# Patient Record
Sex: Female | Born: 1955 | Race: White | Hispanic: No | Marital: Married | Smoking: Former smoker
Health system: Southern US, Community
[De-identification: ages and names within clinical notes are randomized; demographics above are authoritative.]

## PROBLEM LIST (undated history)

## (undated) DIAGNOSIS — F419 Anxiety disorder, unspecified: Secondary | ICD-10-CM

## (undated) DIAGNOSIS — E785 Hyperlipidemia, unspecified: Secondary | ICD-10-CM

## (undated) DIAGNOSIS — I10 Essential (primary) hypertension: Secondary | ICD-10-CM

## (undated) DIAGNOSIS — M810 Age-related osteoporosis without current pathological fracture: Secondary | ICD-10-CM

## (undated) DIAGNOSIS — K219 Gastro-esophageal reflux disease without esophagitis: Secondary | ICD-10-CM

## (undated) HISTORY — DX: Age-related osteoporosis without current pathological fracture: M81.0

## (undated) HISTORY — PX: BUNIONECTOMY: SHX129

## (undated) HISTORY — DX: Essential (primary) hypertension: I10

## (undated) HISTORY — PX: RHINOPLASTY: SUR1284

## (undated) HISTORY — PX: FRONTAL SINUS EXPLORATION: SHX6591

## (undated) HISTORY — DX: Gastro-esophageal reflux disease without esophagitis: K21.9

## (undated) HISTORY — DX: Anxiety disorder, unspecified: F41.9

---

## 1976-07-30 HISTORY — PX: GANGLION CYST EXCISION: SHX1691

## 2005-07-30 HISTORY — PX: ABDOMINAL HYSTERECTOMY: SHX81

## 2014-07-30 HISTORY — PX: COLONOSCOPY: SHX174

## 2015-04-30 LAB — HM COLONOSCOPY

## 2015-06-09 LAB — HM COLONOSCOPY

## 2015-08-19 ENCOUNTER — Encounter: Payer: Self-pay | Admitting: Oncology

## 2015-08-19 ENCOUNTER — Inpatient Hospital Stay: Payer: Managed Care, Other (non HMO) | Attending: Oncology | Admitting: Oncology

## 2015-08-19 ENCOUNTER — Inpatient Hospital Stay: Payer: Managed Care, Other (non HMO)

## 2015-08-19 VITALS — BP 135/78 | HR 74 | Temp 98.2°F | Resp 20 | Ht 64.57 in | Wt 199.1 lb

## 2015-08-19 DIAGNOSIS — D7589 Other specified diseases of blood and blood-forming organs: Secondary | ICD-10-CM | POA: Diagnosis present

## 2015-08-19 DIAGNOSIS — I10 Essential (primary) hypertension: Secondary | ICD-10-CM | POA: Insufficient documentation

## 2015-08-19 DIAGNOSIS — F419 Anxiety disorder, unspecified: Secondary | ICD-10-CM | POA: Insufficient documentation

## 2015-08-19 DIAGNOSIS — M818 Other osteoporosis without current pathological fracture: Secondary | ICD-10-CM | POA: Diagnosis not present

## 2015-08-19 DIAGNOSIS — K219 Gastro-esophageal reflux disease without esophagitis: Secondary | ICD-10-CM | POA: Diagnosis not present

## 2015-08-19 DIAGNOSIS — Z90710 Acquired absence of both cervix and uterus: Secondary | ICD-10-CM | POA: Diagnosis not present

## 2015-08-19 DIAGNOSIS — Z79899 Other long term (current) drug therapy: Secondary | ICD-10-CM | POA: Insufficient documentation

## 2015-08-19 LAB — CBC WITH DIFFERENTIAL/PLATELET
BASOS ABS: 0 10*3/uL (ref 0–0.1)
BASOS PCT: 0 %
EOS PCT: 1 %
Eosinophils Absolute: 0.1 10*3/uL (ref 0–0.7)
HCT: 43.8 % (ref 35.0–47.0)
Hemoglobin: 15 g/dL (ref 12.0–16.0)
Lymphocytes Relative: 30 %
Lymphs Abs: 2.6 10*3/uL (ref 1.0–3.6)
MCH: 33.7 pg (ref 26.0–34.0)
MCHC: 34.2 g/dL (ref 32.0–36.0)
MCV: 98.7 fL (ref 80.0–100.0)
MONO ABS: 0.8 10*3/uL (ref 0.2–0.9)
MONOS PCT: 10 %
Neutro Abs: 5 10*3/uL (ref 1.4–6.5)
Neutrophils Relative %: 59 %
PLATELETS: 319 10*3/uL (ref 150–440)
RBC: 4.44 MIL/uL (ref 3.80–5.20)
RDW: 14 % (ref 11.5–14.5)
WBC: 8.5 10*3/uL (ref 3.6–11.0)

## 2015-08-19 LAB — IRON AND TIBC
IRON: 98 ug/dL (ref 28–170)
SATURATION RATIOS: 26 % (ref 10.4–31.8)
TIBC: 382 ug/dL (ref 250–450)
UIBC: 284 ug/dL

## 2015-08-19 LAB — FERRITIN: FERRITIN: 51 ng/mL (ref 11–307)

## 2015-08-19 LAB — FOLATE: Folate: 19.8 ng/mL (ref >5.9)
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
## 2015-08-19 NOTE — Progress Notes (Signed)
[facility]  Telephone:([phone]) [phone] [phone]) [phone]  ID: Whitney Long OB: January 13, 1956  MR#: [id number]  QIO#:[id number]  No care team member to display  CHIEF COMPLAINT:  Chief Complaint  Patient presents with  . New Evaluation    Abnormal labs    INTERVAL HISTORY: Patient is a 60 year old female recently moved [place] from [place] who was noted to have a persistently elevated MCV without anemia. She currently feels well and is asymptomatic. She has no neurologic complaints. She denies any recent fevers or illnesses. She has a good appetite and denies weight loss. She has no chest pain or shortness of breath. He she denies any nausea, vomiting, constipation, or diarrhea. She has no urinary complaints. Patient feels at her baseline and offers no specific complaints today.  REVIEW OF SYSTEMS:   Review of Systems  Constitutional: Negative.  Negative for fever, weight loss and malaise/fatigue.  Respiratory: Negative.   Cardiovascular: Negative.   Gastrointestinal: Negative.   Musculoskeletal: Negative.   Neurological: Negative.  Negative for weakness.    As per HPI. Otherwise, a complete review of systems is negatve.  PAST MEDICAL HISTORY: Past Medical History  Diagnosis Date  . Hypertension   . Anxiety   . GERD (gastroesophageal reflux disease)   . Osteoporosis     PAST SURGICAL HISTORY: Past Surgical History  Procedure Laterality Date  . Abdominal hysterectomy      FAMILY HISTORY: Reviewed and unchanged. No reported history of malignancy or chronic disease.     ADVANCED DIRECTIVES:    HEALTH MAINTENANCE: Social History  Substance Use Topics  . Smoking status: Not on file  . Smokeless tobacco: Not on file  . Alcohol Use: Not on file     Colonoscopy:  PAP:  Bone density:  Lipid panel:  Allergies  Allergen Reactions  . Sulfa Antibiotics Palpitations    Current Outpatient Prescriptions  Medication Sig Dispense Refill  .  Estradiol (ESTROGEL TD) Place 0.06 % onto the skin 1 day or 1 dose.    . metoprolol (LOPRESSOR) 50 MG tablet Take 50 mg by mouth 2 (two) times daily.    . Risedronate Sodium (ATELVIA) 35 MG TBEC Take 35 mg by mouth once a week.    . simvastatin (ZOCOR) 40 MG tablet Take 40 mg by mouth daily.    . valsartan-hydrochlorothiazide (DIOVAN-HCT) 160-12.5 MG tablet Take 1 tablet by mouth daily.    Marland Kitchen venlafaxine XR (EFFEXOR-XR) 150 MG 24 hr capsule Take 150 mg by mouth daily with breakfast.     No current facility-administered medications for this visit.    OBJECTIVE: Filed Vitals:   08/19/15 1158  BP: 135/78  Pulse: 74  Temp: 98.2 F (36.8 C)  Resp: 20     Body mass index is 33.57 kg/(m^2).    ECOG FS:0 - Asymptomatic  General: Well-developed, well-nourished, no acute distress. Eyes: Pink conjunctiva, anicteric sclera. HEENT: Normocephalic, moist mucous membranes, clear oropharnyx. Lungs: Clear to auscultation bilaterally. Heart: Regular rate and rhythm. No rubs, murmurs, or gallops. Abdomen: Soft, nontender, nondistended. No organomegaly noted, normoactive bowel sounds. Musculoskeletal: No edema, cyanosis, or clubbing. Neuro: Alert, answering all questions appropriately. Cranial nerves grossly intact. Skin: No rashes or petechiae noted. Psych: Normal affect. Lymphatics: No cervical, calvicular, axillary or inguinal LAD.   LAB RESULTS:  No results found for: NA, K, CL, CO2, GLUCOSE, BUN, CREATININE, CALCIUM, PROT, ALBUMIN, AST, ALT, ALKPHOS, BILITOT, GFRNONAA, GFRAA  Lab Results  Component Value Date   WBC 8.5 08/19/2015  NEUTROABS 5.0 08/19/2015   HGB 15.0 08/19/2015   HCT 43.8 08/19/2015   MCV 98.7 08/19/2015   PLT 319 08/19/2015     STUDIES: No results found.  ASSESSMENT: Elevated MCV without anemia.  PLAN:    1. Elevated MCV: Patient's hemoglobin and MCV are within normal limits on laboratory work today. B 12, iron stores, folate, and peripheral blood flow  cytometry are pending at time of dictation. No intervention is needed at this time. Patient has not established a primary care physician in [place] yet. Because of this patient will follow-up in 6 months with repeat laboratory work and further evaluation. She likely be discharged clinic at that time. She has been instructed to obtain a primary care physician in the near future. 2. Hypertension: Continue current medications as above. Establish a primary care physician to further monitor.    Patient expressed understanding and was in agreement with this plan. She also understands that She can call clinic at any time with any questions, concerns, or complaints.   Jeralyn Ruths, MD   08/19/2015 1:16 PM

## 2015-08-20 LAB — VITAMIN B12: VITAMIN B 12: 320 pg/mL (ref 180–914)

## 2015-08-25 LAB — COMP PANEL: LEUKEMIA/LYMPHOMA

## 2015-12-07 DIAGNOSIS — S93421A Sprain of deltoid ligament of right ankle, initial encounter: Secondary | ICD-10-CM

## 2015-12-07 DIAGNOSIS — M84361A Stress fracture, right tibia, initial encounter for fracture: Secondary | ICD-10-CM | POA: Insufficient documentation

## 2015-12-07 HISTORY — DX: Sprain of deltoid ligament of right ankle, initial encounter: S93.421A

## 2015-12-07 HISTORY — DX: Stress fracture, right tibia, initial encounter for fracture: M84.361A

## 2015-12-08 DIAGNOSIS — M76829 Posterior tibial tendinitis, unspecified leg: Secondary | ICD-10-CM

## 2015-12-08 HISTORY — DX: Posterior tibial tendinitis, unspecified leg: M76.829

## 2015-12-21 DIAGNOSIS — M25579 Pain in unspecified ankle and joints of unspecified foot: Secondary | ICD-10-CM | POA: Insufficient documentation

## 2016-02-17 ENCOUNTER — Other Ambulatory Visit: Payer: 59

## 2016-02-17 ENCOUNTER — Ambulatory Visit: Payer: 59 | Admitting: Oncology

## 2016-03-03 ENCOUNTER — Emergency Department
Admission: EM | Admit: 2016-03-03 | Discharge: 2016-03-03 | Disposition: A | Discharge status: Home / Self Care | Payer: Managed Care, Other (non HMO) | Attending: Emergency Medicine | Admitting: Emergency Medicine

## 2016-03-03 ENCOUNTER — Emergency Department: Payer: Managed Care, Other (non HMO)

## 2016-03-03 ENCOUNTER — Encounter: Payer: Self-pay | Admitting: Emergency Medicine

## 2016-03-03 DIAGNOSIS — I10 Essential (primary) hypertension: Secondary | ICD-10-CM | POA: Diagnosis not present

## 2016-03-03 DIAGNOSIS — N201 Calculus of ureter: Secondary | ICD-10-CM | POA: Insufficient documentation

## 2016-03-03 DIAGNOSIS — R1031 Right lower quadrant pain: Secondary | ICD-10-CM | POA: Diagnosis present

## 2016-03-03 LAB — URINALYSIS COMPLETE WITH MICROSCOPIC (ARMC ONLY)
Bacteria, UA: NONE SEEN
Bilirubin Urine: NEGATIVE
Glucose, UA: NEGATIVE mg/dL
Leukocytes, UA: NEGATIVE
NITRITE: NEGATIVE
PH: 6 (ref 5.0–8.0)
PROTEIN: 30 mg/dL — AB
SPECIFIC GRAVITY, URINE: 1.02 (ref 1.005–1.030)

## 2016-03-03 LAB — CBC
HCT: 43.1 % (ref 35.0–47.0)
HEMOGLOBIN: 15.1 g/dL (ref 12.0–16.0)
MCH: 34.7 pg — AB (ref 26.0–34.0)
MCHC: 35.1 g/dL (ref 32.0–36.0)
MCV: 98.7 fL (ref 80.0–100.0)
Platelets: 300 10*3/uL (ref 150–440)
RBC: 4.36 MIL/uL (ref 3.80–5.20)
RDW: 14.2 % (ref 11.5–14.5)
WBC: 11.1 10*3/uL — ABNORMAL HIGH (ref 3.6–11.0)

## 2016-03-03 LAB — COMPREHENSIVE METABOLIC PANEL
ALK PHOS: 82 U/L (ref 38–126)
ALT: 19 U/L (ref 14–54)
ANION GAP: 14 (ref 5–15)
AST: 33 U/L (ref 15–41)
Albumin: 4.3 g/dL (ref 3.5–5.0)
BUN: 13 mg/dL (ref 6–20)
CALCIUM: 9.5 mg/dL (ref 8.9–10.3)
CO2: 21 mmol/L — AB (ref 22–32)
Chloride: 106 mmol/L (ref 101–111)
Creatinine, Ser: 0.9 mg/dL (ref 0.44–1.00)
GFR calc non Af Amer: >60 mL/min (ref >60)
Glucose, Bld: 155 mg/dL — ABNORMAL HIGH (ref 65–99)
Potassium: 3.7 mmol/L (ref 3.5–5.1)
SODIUM: 141 mmol/L (ref 135–145)
TOTAL PROTEIN: 7.6 g/dL (ref 6.5–8.1)
Total Bilirubin: 0.6 mg/dL (ref 0.3–1.2)

## 2016-03-03 LAB — LIPASE, BLOOD: Lipase: 28 U/L (ref 11–51)

## 2016-03-03 MED ORDER — TAMSULOSIN HCL 0.4 MG PO CAPS
0.8000 mg | ORAL_CAPSULE | Freq: Once | ORAL | Status: AC
  Administered 2016-03-03: 0.8 mg via ORAL
  Filled 2016-03-03: qty 2

## 2016-03-03 MED ORDER — OXYCODONE-ACETAMINOPHEN 5-325 MG PO TABS: 1.0000 | ORAL_TABLET | Freq: Four times a day (QID) | ORAL | 0 refills | Status: DC | PRN

## 2016-03-03 MED ORDER — ONDANSETRON HCL 4 MG/2ML IJ SOLN
INTRAMUSCULAR | Status: AC
  Filled 2016-03-03: qty 2

## 2016-03-03 MED ORDER — IOPAMIDOL (ISOVUE-300) INJECTION 61%
100.0000 mL | Freq: Once | INTRAVENOUS | Status: AC | PRN
  Administered 2016-03-03: 100 mL via INTRAVENOUS

## 2016-03-03 MED ORDER — DIATRIZOATE MEGLUMINE & SODIUM 66-10 % PO SOLN
15.0000 mL | Freq: Once | ORAL | Status: AC
  Administered 2016-03-03: 15 mL via ORAL

## 2016-03-03 MED ORDER — ONDANSETRON HCL 4 MG/2ML IJ SOLN
4.0000 mg | Freq: Once | INTRAMUSCULAR | Status: AC
  Administered 2016-03-03: 4 mg via INTRAVENOUS

## 2016-03-03 MED ORDER — HYDROMORPHONE HCL 1 MG/ML IJ SOLN
1.0000 mg | Freq: Once | INTRAMUSCULAR | Status: AC
  Administered 2016-03-03: 1 mg via INTRAVENOUS
  Filled 2016-03-03: qty 1

## 2016-03-03 MED ORDER — MORPHINE SULFATE (PF) 4 MG/ML IV SOLN
4.0000 mg | Freq: Once | INTRAVENOUS | Status: AC
  Administered 2016-03-03: 4 mg via INTRAVENOUS

## 2016-03-03 MED ORDER — OXYCODONE-ACETAMINOPHEN 5-325 MG PO TABS
1.0000 | ORAL_TABLET | ORAL | Status: DC | PRN
  Administered 2016-03-03: 1 via ORAL
  Filled 2016-03-03: qty 1

## 2016-03-03 MED ORDER — MORPHINE SULFATE (PF) 4 MG/ML IV SOLN
INTRAVENOUS | Status: AC
  Filled 2016-03-03: qty 1

## 2016-03-03 MED ORDER — TAMSULOSIN HCL 0.4 MG PO CAPS: 0.4000 mg | ORAL_CAPSULE | Freq: Every day | ORAL | 0 refills | Status: DC

## 2016-03-03 MED ORDER — SODIUM CHLORIDE 0.9 % IV BOLUS (SEPSIS)
1000.0000 mL | Freq: Once | INTRAVENOUS | Status: AC
  Administered 2016-03-03: 1000 mL via INTRAVENOUS

## 2016-03-03 NOTE — ED Provider Notes (Signed)
[facility] Emergency Department Provider Note   ____________________________________________   None    3:40 PM  I have reviewed the triage vital signs and the nursing notes.   HISTORY  Chief Complaint Abdominal Pain and Numbness   HPI Whitney Long is a 60 y.o. female with a history of anxiety as well as hypertension who is presenting to the emergency department with 4 days of intermittent right flank and abdominal pain. Said that the pain started Thursday morning in her right upper quadrant and right flank and is now radiated down ter quadrant. Says that she also feels pressure when she urinates. Says that the pain at its maximum isa 10 out of 10 but says that her pain now after Percocet as a 3 out of 10 and is no longer requesting any pain medication. She said that earlier today she went to urgenissue and was having so much pain she felt tingling in her bilateral hands as well as cramping in her legs. However, she has no numbness or pain in her extremities at this time.Said yesterday she felt nauseous but no longer has any nausea. One of diarrhea yesterday as well. Denies any recent antibiotics. Denies any fever.   Past Medical History:  Diagnosis Date  . Anxiety   . GERD (gastroesophageal reflux disease)   . Hypertension   . Osteoporosis     There are no active problems to display for this patient.   Past Surgical History:  Procedure Laterality Date  . ABDOMINAL HYSTERECTOMY      Prior to Admission medications   Medication Sig Start Date End Date Taking? Authorizing Provider  Estradiol (ESTROGEL TD) Place 0.06 % onto the skin 1 day or 1 dose.    Historical Provider, MD  metoprolol (LOPRESSOR) 50 MG tablet Take 50 mg by mouth 2 (two) times daily.    Historical Provider, MD  Risedronate Sodium (ATELVIA) 35 MG TBEC Take 35 mg by mouth once a week.    Historical Provider, MD  simvastatin (ZOCOR) 40 MG tablet Take 40 mg by mouth daily.     Historical Provider, MD  valsartan-hydrochlorothiazide (DIOVAN-HCT) 160-12.5 MG tablet Take 1 tablet by mouth daily.    Historical Provider, MD  venlafaxine XR (EFFEXOR-XR) 150 MG 24 hr capsule Take 150 mg by mouth daily with breakfast.    Historical Provider, MD    Allergies Sulfa antibiotics  No family history on file.  Social History Social History  Substance Use Topics  . Smoking status: Never Smoker  . Smokeless tobacco: Never Used  . Alcohol use No    Review of Systems Constitutional: No fever/chills Eyes: No visual changes. ENT: No sore throat. Cardiovascular: Denies chest pain. Respiratory: Denies shortness of breath. Gastrointestinal:no vomiting.   No constipation. Genitourinary: as above Musculoskeletal: as above Skin: Negative for rash. Neurological: Negative for headaches, focal weakness or numbness.  10-point ROS otherwise negative.  ____________________________________________   PHYSICAL EXAM:  VITAL SIGNS: ED Triage Vitals  Enc Vitals Group     BP 03/03/16 1348 (!) 145/76     Pulse Rate 03/03/16 1348 79     Resp 03/03/16 1348 20     Temp 03/03/16 1348 97.7 F (36.5 C)     Temp Source 03/03/16 1348 Oral     SpO2 03/03/16 1348 100 %     Weight 03/03/16 1348 205 lb (93 kg)     Height 03/03/16 1348 5\' 4"  (1.626 m)     Head Circumference --  Peak Flow --      Pain Score 03/03/16 1345 8     Pain Loc --      Pain Edu? --      Excl. in GC? --     Constitutional: Alert and oriented. Well appearing and in no acute distress. Eyes: Conjunctivae are normal. PERRL. EOMI. Head: Atraumatic. Nose: No congestion/rhinnorhea. Mouth/Throat: Mucous membranes are moist.   Neck: No stridor.   Cardiovascular: Normal rate, regular rhythm. Grossly normal heart sounds.  Good peripheral circulation. Respiratory: Normal respiratory effort.  No retractions. Lungs CTAB. Gastrointestinal: Soft with mild-to-moderate tenderness to the right upper quadrant  With a  negative Murphy sign. Very mild right lower quadrant tenderness palpation. o distention. No CVA tenderness. Musculoskeletal: No lower extremity tenderness nor edema.  No joint effusions. Neurologic:  Normal speech and language. No gross focal neurologic deficits are appreciated. Skin:  Skin is warm, dry and intact. No rash noted. Psychiatric: Mood and affect are normal. Speech and behavior are normal.  ____________________________________________   LABS (all labs ordered are listed, but only abnormal results are displayed)  Labs Reviewed  COMPREHENSIVE METABOLIC PANEL - Abnormal; Notable for the following:       Result Value   CO2 21 (*)    Glucose, Bld 155 (*)    All other components within normal limits  CBC - Abnormal; Notable for the following:    WBC 11.1 (*)    MCH 34.7 (*)    All other components within normal limits  URINALYSIS COMPLETEWITH MICROSCOPIC ([facility] ONLY) - Abnormal; Notable for the following:    Color, Urine YELLOW (*)    APPearance HAZY (*)    Ketones, ur 1+ (*)    Hgb urine dipstick 3+ (*)    Protein, ur 30 (*)    Squamous Epithelial / LPF 0-5 (*)    All other components within normal limits  LIPASE, BLOOD   ____________________________________________  EKG   ____________________________________________  RADIOLOGY Study Result   CLINICAL DATA:  Patient with lower abdominal pain for 3 days.  EXAM: CT ABDOMEN AND PELVIS WITH CONTRAST  TECHNIQUE: Multidetector CT imaging of the abdomen and pelvis was performed using the standard protocol following bolus administration of intravenous contrast.  CONTRAST:  ISOVUE-300 IOPAMIDOL (ISOVUE-300) INJECTION 61%  COMPARISON:  None.  FINDINGS: Lower chest: Normal heart size. Lung bases are clear. No pleural effusion.  Hepatobiliary: Liver is normal in size and contour. Within the right hepatic lobe there is a 2.7 x 2.2 cm enhancing mass (image 20; series 2). Gallbladder is  unremarkable.  Pancreas: Unremarkable  Spleen: Unremarkable  Adrenals/Urinary Tract: Normal adrenal glands. Delayed enhancement of the right kidney. Moderate right hydroureteronephrosis to the level of the distal right ureter were there is an obstructing 5 mm stone (image 72; series 2). Normal urinary bladder.  Stomach/Bowel: Sigmoid colonic diverticulosis. No CT evidence for acute diverticulitis. Normal appendix. No evidence for bowel obstruction. Small hiatal hernia. Normal morphology of the stomach.  Vascular/Lymphatic: Normal caliber abdominal aorta. Peripheral calcified atherosclerotic plaque. No retroperitoneal lymphadenopathy.  Other: Prostate unremarkable.  Musculoskeletal: Lumbar spine degenerative changes. No aggressive or acute appearing osseous lesions.  IMPRESSION: There is an obstructing 5 mm stone within the distal right ureter resulting in moderate right hydroureteronephrosis. No contrast is excreted within the right renal collecting system on delayed images compatible with high-grade obstruction.  There is an indeterminate 2.7 cm enhancing mass within the right hepatic lobe. Recommend dedicated evaluation of this mass with pre and post  contrast-enhanced liver MRI.   Electronically Signed   By: Annia Belt M.D.   On: 03/03/2016 17:52      ____________________________________________   PROCEDURES  Procedure(s) performed:   Procedures  Critical Care performed:   ____________________________________________   INITIAL IMPRESSION / ASSESSMENT AND PLAN / ED COURSE  Pertinent labs & imaging results that were available during my care of the patient were reviewed by me and considered in my medical decision making (see chart for details).  ----------------------------------------- 6:43 PM on 03/03/2016 -----------------------------------------  Patient, after Percocet, morphine and Dilaudid is now pain-free. She says that the pain  stopped after she urinated. It is possible that she finally passed her stone. Found to have a high-grade, 5 mm obstructing stone or CT of her abdomen and pelvis. I discussed the case with Dr. Clent Ridges of urology because of the high-grade obstruction read on the CAT scan. I also discussed with the patient was now pain-free and Dr. Clent Ridges thought that this would make the patient appropriate for trial of passages and outpatient. The patient also does not appear to have an infected urine and is afebrile. Mild white count. I discussed return precautions with the patient as well as her husband. They know to return immediately for any worsening or concerning symptoms especially worsening abdominal pain or fever. I also reviewed the imaging results with the patient including the liver lesion which she says that she is familiar with and has known about. She will continue to follow with her primary care doctor. The patient will be discharged with a strainer as well and knows to urinate through it and bring the stone to the urologist if she is able to catch it.  Clinical Course     ____________________________________________   FINAL CLINICAL IMPRESSION(S) / ED DIAGNOSES  Right-sided ureterolithiasis.    NEW MEDICATIONS STARTED DURING THIS VISIT:  New Prescriptions   No medications on file     Note:  This document was prepared using Dragon voice recognition software and may include unintentional dictation errors.    Myrna Blazer, MD 03/03/16 [phone]

## 2016-03-03 NOTE — ED Triage Notes (Signed)
Presents from urgent care with right lower abd pain which radiates into back  While at urgent care she developed some numbness and tingling to arms/hands  Feels lie muscle are cramping up

## 2016-03-03 NOTE — ED Notes (Signed)
Pt given urine strainer

## 2016-03-03 NOTE — ED Notes (Signed)
Pt has has LRQ pain since Wednesday. Pt says the pain can be felt in her back and over the last couple days has had pressure in her lower abdomen and the feeling of frequent urination. Pt does states no pain when urinating. Pt is resting in bed and family at the bedside.

## 2016-03-03 NOTE — ED Notes (Signed)
EDP made aware of pts pain, no orders received at this time

## 2016-03-07 ENCOUNTER — Ambulatory Visit (INDEPENDENT_AMBULATORY_CARE_PROVIDER_SITE_OTHER): Payer: Managed Care, Other (non HMO) | Admitting: Urology

## 2016-03-07 ENCOUNTER — Encounter: Payer: Self-pay | Admitting: Urology

## 2016-03-07 VITALS — BP 123/82 | HR 76 | Ht 64.0 in | Wt 202.5 lb

## 2016-03-07 DIAGNOSIS — N200 Calculus of kidney: Secondary | ICD-10-CM

## 2016-03-07 LAB — MICROSCOPIC EXAMINATION
Bacteria, UA: NONE SEEN
WBC, UA: NONE SEEN /hpf (ref 0–?)

## 2016-03-07 LAB — URINALYSIS, COMPLETE
BILIRUBIN UA: NEGATIVE
Glucose, UA: NEGATIVE
Ketones, UA: NEGATIVE
LEUKOCYTES UA: NEGATIVE
Nitrite, UA: NEGATIVE
PH UA: 6 (ref 5.0–7.5)
Protein, UA: NEGATIVE
Specific Gravity, UA: 1.01 (ref 1.005–1.030)
Urobilinogen, Ur: 0.2 mg/dL (ref 0.2–1.0)

## 2016-03-07 NOTE — Progress Notes (Addendum)
03/07/2016 9:58 AM   Whitney Long May 04, 1956 [id number]  Referring provider: No referring provider defined for this encounter.  Chief Complaint  Patient presents with  . New Patient (Initial Visit)    renal stone    HPI: The patient is a 60 year old female who presents for ER follow-up after being diagnosed with a distal 5 mm stone. The patient originally presented with severe right flank pain. She denied nausea or vomiting, fever or chills. Her pain is since subsided. She is unsure she passed the stone. She has been straining her urine. She denies dysuria at this time. This is her first kidney stone.   PMH: Past Medical History:  Diagnosis Date  . Anxiety   . GERD (gastroesophageal reflux disease)   . Hypertension   . Osteoporosis     Surgical History: Past Surgical History:  Procedure Laterality Date  . ABDOMINAL HYSTERECTOMY      Home Medications:    Medication List       Accurate as of 03/07/16  9:58 AM. Always use your most recent med list.          ATELVIA 35 MG Tbec Generic drug:  Risedronate Sodium Take 35 mg by mouth once a week.   ESTROGEL TD Place 0.06 % onto the skin 1 day or 1 dose.   meloxicam 7.5 MG tablet Commonly known as:  MOBIC Take 7.5 mg by mouth daily.   metoprolol 50 MG tablet Commonly known as:  LOPRESSOR Take 50 mg by mouth 2 (two) times daily.   oxyCODONE-acetaminophen 5-325 MG tablet Commonly known as:  ROXICET Take 1-2 tablets by mouth every 6 (six) hours as needed.   simvastatin 40 MG tablet Commonly known as:  ZOCOR Take 40 mg by mouth daily.   tamsulosin 0.4 MG Caps capsule Commonly known as:  FLOMAX Take 1 capsule (0.4 mg total) by mouth daily.   valsartan-hydrochlorothiazide 160-12.5 MG tablet Commonly known as:  DIOVAN-HCT Take 1 tablet by mouth daily.   venlafaxine XR 150 MG 24 hr capsule Commonly known as:  EFFEXOR-XR Take 150 mg by mouth daily with breakfast.       Allergies:  Allergies    Allergen Reactions  . Sulfa Antibiotics Palpitations    Other reaction(s): Headache    Family History: No family history on file.  Social History:  reports that she has never smoked. She has never used smokeless tobacco. She reports that she does not drink alcohol. Her drug history is not on file.  ROS: UROLOGY Frequent Urination?: Yes Hard to postpone urination?: No Burning/pain with urination?: No Get up at night to urinate?: No Leakage of urine?: No Urine stream starts and stops?: No Trouble starting stream?: No Do you have to strain to urinate?: No Blood in urine?: Yes Urinary tract infection?: No Sexually transmitted disease?: No Injury to kidneys or bladder?: No Painful intercourse?: No Weak stream?: No Currently pregnant?: No Vaginal bleeding?: No Last menstrual period?: No  Gastrointestinal Nausea?: Yes Vomiting?: Yes Indigestion/heartburn?: Yes Diarrhea?: Yes Constipation?: No  Constitutional Fever: No Night sweats?: No Weight loss?: No Fatigue?: Yes  Skin Skin rash/lesions?: No Itching?: No  Eyes Blurred vision?: No Double vision?: No  Ears/Nose/Throat Sore throat?: No Sinus problems?: No  Hematologic/Lymphatic Swollen glands?: No Easy bruising?: Yes  Cardiovascular Leg swelling?: No Chest pain?: No  Respiratory Cough?: No Shortness of breath?: No  Endocrine Excessive thirst?: No  Musculoskeletal Back pain?: No Joint pain?: Yes  Neurological Headaches?: Yes Dizziness?: Yes  Psychologic Depression?: No  Anxiety?: No  Physical Exam: BP 123/82 (BP Location: Left Arm, Patient Position: Sitting, Cuff Size: Large)   Pulse 76   Ht 5\' 4"  (1.626 m)   Wt 202 lb 8 oz (91.9 kg)   BMI 34.76 kg/m   Constitutional:  Alert and oriented, No acute distress. HEENT: [place] AT, moist mucus membranes.  Trachea midline, no masses. Cardiovascular: No clubbing, cyanosis, or edema. Respiratory: Normal respiratory effort, no increased work of  breathing. GI: Abdomen is soft, nontender, nondistended, no abdominal masses GU: No CVA tenderness.  Skin: No rashes, bruises or suspicious lesions. Lymph: No cervical or inguinal adenopathy. Neurologic: Grossly intact, no focal deficits, moving all 4 extremities. Psychiatric: Normal mood and affect.  Laboratory Data: Lab Results  Component Value Date   WBC 11.1 (H) 03/03/2016   HGB 15.1 03/03/2016   HCT 43.1 03/03/2016   MCV 98.7 03/03/2016   PLT 300 03/03/2016    Lab Results  Component Value Date   CREATININE 0.90 03/03/2016    No results found for: PSA  No results found for: TESTOSTERONE  No results found for: HGBA1C  Urinalysis    Component Value Date/Time   COLORURINE YELLOW (A) 03/03/2016 1352   APPEARANCEUR HAZY (A) 03/03/2016 1352   LABSPEC 1.020 03/03/2016 1352   PHURINE 6.0 03/03/2016 1352   GLUCOSEU NEGATIVE 03/03/2016 1352   HGBUR 3+ (A) 03/03/2016 1352   BILIRUBINUR NEGATIVE 03/03/2016 1352   KETONESUR 1+ (A) 03/03/2016 1352   PROTEINUR 30 (A) 03/03/2016 1352   NITRITE NEGATIVE 03/03/2016 1352   LEUKOCYTESUR NEGATIVE 03/03/2016 1352    Pertinent Imaging: CLINICAL DATA:  Patient with lower abdominal pain for 3 days.  EXAM: CT ABDOMEN AND PELVIS WITH CONTRAST  TECHNIQUE: Multidetector CT imaging of the abdomen and pelvis was performed using the standard protocol following bolus administration of intravenous contrast.  CONTRAST:  100mL ISOVUE-300 IOPAMIDOL (ISOVUE-300) INJECTION 61%  COMPARISON:  None.  FINDINGS: Lower chest: Normal heart size. Lung bases are clear. No pleural effusion.  Hepatobiliary: Liver is normal in size and contour. Within the right hepatic lobe there is a 2.7 x 2.2 cm enhancing mass (image 20; series 2). Gallbladder is unremarkable.  Pancreas: Unremarkable  Spleen: Unremarkable  Adrenals/Urinary Tract: Normal adrenal glands. Delayed enhancement of the right kidney. Moderate right hydroureteronephrosis  to the level of the distal right ureter were there is an obstructing 5 mm stone (image 72; series 2). Normal urinary bladder.  Stomach/Bowel: Sigmoid colonic diverticulosis. No CT evidence for acute diverticulitis. Normal appendix. No evidence for bowel obstruction. Small hiatal hernia. Normal morphology of the stomach.  Vascular/Lymphatic: Normal caliber abdominal aorta. Peripheral calcified atherosclerotic plaque. No retroperitoneal lymphadenopathy.  Other: Prostate unremarkable.  Musculoskeletal: Lumbar spine degenerative changes. No aggressive or acute appearing osseous lesions.  IMPRESSION: There is an obstructing 5 mm stone within the distal right ureter resulting in moderate right hydroureteronephrosis. No contrast is excreted within the right renal collecting system on delayed images compatible with high-grade obstruction.  There is an indeterminate 2.7 cm enhancing mass within the right hepatic lobe. Recommend dedicated evaluation of this mass with pre and post contrast-enhanced liver MRI.  Assessment & Plan:    1. Right ureteral stone -Continue medical expulsive therapy though the stone may have alreadhy passed -We'll ensure stone passage with a KUB and renal ultrasound in 2 weeks.   Return in about 2 weeks (around 03/21/2016) for with KUB, renal u/s prior.  Hildred LaserBrian James Maximillian Habibi, MD  [facility] [address], [address]  [place], [place] [address] [phone]

## 2016-03-22 ENCOUNTER — Ambulatory Visit
Admission: RE | Admit: 2016-03-22 | Discharge: 2016-03-22 | Disposition: A | Discharge status: Home / Self Care | Payer: Managed Care, Other (non HMO) | Source: Ambulatory Visit | Attending: Urology | Admitting: Urology

## 2016-03-22 DIAGNOSIS — N200 Calculus of kidney: Secondary | ICD-10-CM

## 2016-03-29 ENCOUNTER — Ambulatory Visit (INDEPENDENT_AMBULATORY_CARE_PROVIDER_SITE_OTHER): Payer: Managed Care, Other (non HMO) | Admitting: Urology

## 2016-03-29 VITALS — BP 123/80 | HR 85 | Ht 64.0 in | Wt 200.2 lb

## 2016-03-29 DIAGNOSIS — N200 Calculus of kidney: Secondary | ICD-10-CM

## 2016-03-29 LAB — MICROSCOPIC EXAMINATION: BACTERIA UA: NONE SEEN

## 2016-03-29 LAB — URINALYSIS, COMPLETE
Bilirubin, UA: NEGATIVE
Glucose, UA: NEGATIVE
Ketones, UA: NEGATIVE
LEUKOCYTES UA: NEGATIVE
Nitrite, UA: NEGATIVE
PH UA: 6 (ref 5.0–7.5)
PROTEIN UA: NEGATIVE
Specific Gravity, UA: 1.015 (ref 1.005–1.030)
Urobilinogen, Ur: 0.2 mg/dL (ref 0.2–1.0)

## 2016-03-29 NOTE — Progress Notes (Signed)
03/29/2016 8:54 AM   Whitney Long [birth year] [id number]  Referring provider: No referring provider defined for this encounter.  Chief Complaint  Patient presents with  . Nephrolithiasis    HPI: The patient is a 60 -year-old female was originally diagnosed with a distal right 5 mm meter stone in the ED. Her pain resolved but her stone was never caught in a strainer. She presents today for follow-up after obtaining a renal ultrasound and KUB. This was her first stone.  KUB and renal u/s show no stone or hydronephrosis.   PMH: Past Medical History:  Diagnosis Date  . Anxiety   . GERD (gastroesophageal reflux disease)   . Hypertension   . Osteoporosis     Surgical History: Past Surgical History:  Procedure Laterality Date  . ABDOMINAL HYSTERECTOMY      Home Medications:    Medication List       Accurate as of 03/29/16  8:54 AM. Always use your most recent med list.          ATELVIA 35 MG Tbec Generic drug:  Risedronate Sodium Take 35 mg by mouth once a week.   ESTROGEL TD Place 0.06 % onto the skin 1 day or 1 dose.   meloxicam 7.5 MG tablet Commonly known as:  MOBIC Take 7.5 mg by mouth daily.   metoprolol 50 MG tablet Commonly known as:  LOPRESSOR Take 50 mg by mouth 2 (two) times daily.   oxyCODONE-acetaminophen 5-325 MG tablet Commonly known as:  ROXICET Take 1-2 tablets by mouth every 6 (six) hours as needed.   simvastatin 40 MG tablet Commonly known as:  ZOCOR Take 40 mg by mouth daily.   tamsulosin 0.4 MG Caps capsule Commonly known as:  FLOMAX Take 1 capsule (0.4 mg total) by mouth daily.   valsartan-hydrochlorothiazide 160-12.5 MG tablet Commonly known as:  DIOVAN-HCT Take 1 tablet by mouth daily.   venlafaxine XR 150 MG 24 hr capsule Commonly known as:  EFFEXOR-XR Take 150 mg by mouth daily with breakfast.       Allergies:  Allergies  Allergen Reactions  . Sulfa Antibiotics Palpitations    Other reaction(s): Headache     Family History: No family history on file.  Social History:  reports that she has never smoked. She has never used smokeless tobacco. She reports that she does not drink alcohol. Her drug history is not on file.  ROS: UROLOGY Frequent Urination?: No Hard to postpone urination?: No Burning/pain with urination?: No Get up at night to urinate?: No Leakage of urine?: No Urine stream starts and stops?: No Trouble starting stream?: No Do you have to strain to urinate?: No Blood in urine?: No Urinary tract infection?: No Sexually transmitted disease?: No Injury to kidneys or bladder?: No Painful intercourse?: No Weak stream?: No Currently pregnant?: No Vaginal bleeding?: No Last menstrual period?: No  Gastrointestinal Nausea?: No Vomiting?: No Indigestion/heartburn?: No Diarrhea?: No Constipation?: No  Constitutional Fever: No Night sweats?: No Weight loss?: No Fatigue?: No  Skin Skin rash/lesions?: No Itching?: No  Eyes Blurred vision?: No Double vision?: No  Ears/Nose/Throat Sore throat?: No Sinus problems?: No  Hematologic/Lymphatic Swollen glands?: No Easy bruising?: No  Cardiovascular Leg swelling?: No Chest pain?: No  Respiratory Cough?: No Shortness of breath?: No  Endocrine Excessive thirst?: No  Musculoskeletal Back pain?: No Joint pain?: No  Neurological Headaches?: No Dizziness?: No  Psychologic Depression?: No Anxiety?: No  Physical Exam: BP 123/80   Pulse 85   Ht 5\' 4"  (  1.626 m)   Wt 200 lb 3.2 oz (90.8 kg)   BMI 34.36 kg/m   Constitutional:  Alert and oriented, No acute distress. HEENT: [place] AT, moist mucus membranes.  Trachea midline, no masses. Cardiovascular: No clubbing, cyanosis, or edema. Respiratory: Normal respiratory effort, no increased work of breathing. GI: Abdomen is soft, nontender, nondistended, no abdominal masses GU: No CVA tenderness.  Skin: No rashes, bruises or suspicious lesions. Lymph: No  cervical or inguinal adenopathy. Neurologic: Grossly intact, no focal deficits, moving all 4 extremities. Psychiatric: Normal mood and affect.  Laboratory Data: Lab Results  Component Value Date   WBC 11.1 (H) 03/03/2016   HGB 15.1 03/03/2016   HCT 43.1 03/03/2016   MCV 98.7 03/03/2016   PLT 300 03/03/2016    Lab Results  Component Value Date   CREATININE 0.90 03/03/2016    No results found for: PSA  No results found for: TESTOSTERONE  No results found for: HGBA1C  Urinalysis    Component Value Date/Time   COLORURINE YELLOW (A) 03/03/2016 1352   APPEARANCEUR Clear 03/07/2016 0835   LABSPEC 1.020 03/03/2016 1352   PHURINE 6.0 03/03/2016 1352   GLUCOSEU Negative 03/07/2016 0835   HGBUR 3+ (A) 03/03/2016 1352   BILIRUBINUR Negative 03/07/2016 0835   KETONESUR 1+ (A) 03/03/2016 1352   PROTEINUR Negative 03/07/2016 0835   PROTEINUR 30 (A) 03/03/2016 1352   NITRITE Negative 03/07/2016 0835   NITRITE NEGATIVE 03/03/2016 1352   LEUKOCYTESUR Negative 03/07/2016 0835    Pertinent Imaging: KUB and renal u/s were reviewed. No stone. No hydro.  Assessment & Plan:    1. Right ureteral stone. -stone has passed. No residual hydronephrosis. Follow up prn.  Return if symptoms worsen or fail to improve.  Hildred Laser, MD  [facility] [address] [place], [place] [address] [phone]

## 2016-05-28 ENCOUNTER — Other Ambulatory Visit: Payer: Self-pay

## 2016-05-28 ENCOUNTER — Ambulatory Visit (INDEPENDENT_AMBULATORY_CARE_PROVIDER_SITE_OTHER): Payer: Managed Care, Other (non HMO) | Admitting: Internal Medicine

## 2016-05-28 ENCOUNTER — Encounter: Payer: Self-pay | Admitting: Internal Medicine

## 2016-05-28 VITALS — BP 126/83 | HR 82 | Resp 16 | Ht 64.0 in | Wt 204.0 lb

## 2016-05-28 DIAGNOSIS — D7589 Other specified diseases of blood and blood-forming organs: Secondary | ICD-10-CM | POA: Insufficient documentation

## 2016-05-28 DIAGNOSIS — E538 Deficiency of other specified B group vitamins: Secondary | ICD-10-CM | POA: Insufficient documentation

## 2016-05-28 DIAGNOSIS — I10 Essential (primary) hypertension: Secondary | ICD-10-CM | POA: Diagnosis not present

## 2016-05-28 DIAGNOSIS — J3089 Other allergic rhinitis: Secondary | ICD-10-CM | POA: Diagnosis not present

## 2016-05-28 DIAGNOSIS — E782 Mixed hyperlipidemia: Secondary | ICD-10-CM | POA: Diagnosis not present

## 2016-05-28 DIAGNOSIS — K219 Gastro-esophageal reflux disease without esophagitis: Secondary | ICD-10-CM | POA: Insufficient documentation

## 2016-05-28 DIAGNOSIS — Z23 Encounter for immunization: Secondary | ICD-10-CM

## 2016-05-28 DIAGNOSIS — M8580 Other specified disorders of bone density and structure, unspecified site: Secondary | ICD-10-CM | POA: Diagnosis not present

## 2016-05-28 DIAGNOSIS — Z87442 Personal history of urinary calculi: Secondary | ICD-10-CM | POA: Diagnosis not present

## 2016-05-28 DIAGNOSIS — K7689 Other specified diseases of liver: Secondary | ICD-10-CM

## 2016-05-28 DIAGNOSIS — N951 Menopausal and female climacteric states: Secondary | ICD-10-CM | POA: Insufficient documentation

## 2016-05-28 DIAGNOSIS — K769 Liver disease, unspecified: Secondary | ICD-10-CM | POA: Insufficient documentation

## 2016-05-28 NOTE — Progress Notes (Signed)
Date:  05/28/2016   Name:  Whitney Long   DOB:  [birth year]   MRN:  [id number]   Chief Complaint: Establish Care (Moved to [place] this past year from [place]. ); Hypertension (was following Cardiologist for HTN and Cholesterol in [place] explained we can treat this. ); and Hyperlipidemia Hypertension  This is a chronic problem. The current episode started more than 1 year ago. The problem is unchanged. The problem is controlled. Pertinent negatives include no chest pain, headaches, palpitations or shortness of breath. Past treatments include beta blockers, angiotensin blockers and diuretics. There is no history of kidney disease, CAD/MI, left ventricular hypertrophy or retinopathy.  Hyperlipidemia  This is a chronic problem. The problem is controlled. There are no known factors aggravating her hyperlipidemia. Pertinent negatives include no chest pain, focal weakness, leg pain, myalgias or shortness of breath. Current antihyperlipidemic treatment includes statins.  Gastroesophageal Reflux  She complains of heartburn and wheezing (sometimes with allergy sx). She reports no abdominal pain, no chest pain or no coughing. This is a chronic problem. The problem has been waxing and waning. Pertinent negatives include no fatigue. Risk factors include hiatal hernia. She has tried a PPI for the symptoms. The treatment provided significant relief. Past procedures include an EGD.  Liver lesion - noted on CT for kidney stones this year.  She reports having this noted 10 yrs ago at time of hysterectomy.  Biopsy was done - reported benign with no further evaluation needed.  Some mention of fatty liver.  She is not sure the previous size. (Hepatobiliary: Liver is normal in size and contour. Within the right hepatic lobe there is a 2.7 x 2.2 cm enhancing mass (image 20; series 2).  HRT/Menopause -has been on effexor and topical estrogen for 10 years.  Doing well and would like to continue medication. Kidney stone -  passed a stone this past summer.  No further stones and has not seen the Urologist.  US did not show any additional stones. Osteopenia - dx'd a few years ago with DEXA.  Started bisphosphonate therapy.  Due for follow up study that she will discuss with GYN next week.  Review of Systems  Constitutional: Negative for chills, fatigue, fever and unexpected weight change.  HENT: Negative for congestion and postnasal drip.   Respiratory: Positive for wheezing (sometimes with allergy sx). Negative for cough, chest tightness and shortness of breath.   Cardiovascular: Negative for chest pain, palpitations and leg swelling.  Gastrointestinal: Positive for heartburn. Negative for abdominal pain.  Genitourinary: Negative for dysuria and menstrual problem.  Musculoskeletal: Positive for arthralgias (from ankle sprain on right and torn ligament on left). Negative for myalgias.  Skin: Negative for rash.  Neurological: Negative for dizziness, tremors, focal weakness, syncope and headaches.  Hematological: Negative for adenopathy.  Psychiatric/Behavioral: Negative for sleep disturbance.    Patient Active Problem List   Diagnosis Date Noted  . 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 Posterior tibial tendinitis 12/08/2015  . Sprain of deltoid ligament of right ankle 12/07/2015  . Stress fracture of right tibia 12/07/2015    Prior to Admission medications   Medication Sig Start Date End Date Taking? Authorizing Provider  esomeprazole (NEXIUM) 20 MG capsule Take 20 mg by mouth daily at 12 noon.   Yes Historical Provider, MD  Estradiol (ESTROGEL TD) Place 0.06 % onto the skin 1 day or 1 dose.   Yes Historical Provider, MD  fluticasone (FLONASE) 50 MCG/ACT nasal spray Place into both nostrils as needed  for allergies or rhinitis.   Yes Historical Provider, MD  metoprolol (LOPRESSOR) 50 MG tablet Take by mouth.   Yes Historical Provider, MD  simvastatin (ZOCOR) 40 MG tablet Take by mouth.   Yes Historical  Provider, MD  valsartan-hydrochlorothiazide (DIOVAN-HCT) 160-12.5 MG tablet Take by mouth.   Yes Historical Provider, MD  Venlafaxine HCl 150 MG TB24 Take by mouth.   Yes Historical Provider, MD    Allergies  Allergen Reactions  . Sulfa Antibiotics Palpitations    Other reaction(s): Headache    Past Surgical History:  Procedure Laterality Date  . ABDOMINAL HYSTERECTOMY    . BUNIONECTOMY Left   . FRONTAL SINUS EXPLORATION    . RHINOPLASTY      Social History  Substance Use Topics  . Smoking status: Former Games developermoker  . Smokeless tobacco: Never Used  . Alcohol use No     Medication list has been reviewed and updated.   Physical Exam  Constitutional: She is oriented to person, place, and time. She appears well-developed. No distress.  HENT:  Head: Normocephalic and atraumatic.  Neck: Normal range of motion. Neck supple. Carotid bruit is not present. No thyromegaly present.  Cardiovascular: Normal rate, regular rhythm, normal heart sounds and intact distal pulses.   Pulmonary/Chest: Effort normal. No respiratory distress. She has no decreased breath sounds. She has wheezes in the right upper field.  Musculoskeletal: Normal range of motion.  Neurological: She is alert and oriented to person, place, and time. She has normal strength and normal reflexes. Gait normal.  Skin: Skin is warm, dry and intact. No rash noted.  Psychiatric: She has a normal mood and affect. Her speech is normal and behavior is normal. Thought content normal.  Nursing note and vitals reviewed.   BP 126/83   Pulse 82   Resp 16   Ht 5\' 4"  (1.626 m)   Wt 204 lb (92.5 kg)   SpO2 96%   BMI 35.02 kg/m   Assessment and Plan: 1. Essential hypertension controlled  2. Environmental and seasonal allergies Stable May need albuterol MDI if wheezing worsens  3. Mixed hyperlipidemia On statin therapy  4. Menopause syndrome Doing well on Effexor and HRT topical  5. Gastroesophageal reflux disease  without esophagitis Controlled on Nexium  6. Osteopenia, unspecified location On bisphosphonate therapy - due for DEXA which she will discuss with GYN  7. Vitamin B12 deficiency Continue oral supplements  8. Macrocytosis Chronic, mild  9. Need for influenza vaccination - Flu Vaccine QUAD 36+ mos IM  10. History of kidney stones   11. Lesion of right lobe of liver Need records of previous evaluation Likely will need dedicated MRI   Bari EdwardLaura Camri Molloy, MD [facility]  05/28/2016

## 2016-05-31 ENCOUNTER — Other Ambulatory Visit: Payer: Self-pay | Admitting: Obstetrics and Gynecology

## 2016-05-31 DIAGNOSIS — Z1231 Encounter for screening mammogram for malignant neoplasm of breast: Secondary | ICD-10-CM

## 2016-07-09 ENCOUNTER — Other Ambulatory Visit: Payer: Self-pay | Admitting: Obstetrics and Gynecology

## 2016-07-09 ENCOUNTER — Ambulatory Visit
Admission: RE | Admit: 2016-07-09 | Discharge: 2016-07-09 | Disposition: A | Discharge status: Home / Self Care | Payer: Managed Care, Other (non HMO) | Source: Ambulatory Visit | Attending: Obstetrics and Gynecology | Admitting: Obstetrics and Gynecology

## 2016-07-09 DIAGNOSIS — Z1231 Encounter for screening mammogram for malignant neoplasm of breast: Secondary | ICD-10-CM | POA: Insufficient documentation

## 2016-07-24 ENCOUNTER — Other Ambulatory Visit: Payer: Self-pay | Admitting: Obstetrics and Gynecology

## 2016-07-24 DIAGNOSIS — R928 Other abnormal and inconclusive findings on diagnostic imaging of breast: Secondary | ICD-10-CM

## 2016-07-24 DIAGNOSIS — N6320 Unspecified lump in the left breast, unspecified quadrant: Secondary | ICD-10-CM

## 2016-07-26 ENCOUNTER — Ambulatory Visit
Admission: RE | Admit: 2016-07-26 | Discharge: 2016-07-26 | Disposition: A | Discharge status: Home / Self Care | Payer: Managed Care, Other (non HMO) | Source: Ambulatory Visit | Attending: Obstetrics and Gynecology | Admitting: Obstetrics and Gynecology

## 2016-07-26 DIAGNOSIS — N6320 Unspecified lump in the left breast, unspecified quadrant: Secondary | ICD-10-CM

## 2016-07-26 DIAGNOSIS — R928 Other abnormal and inconclusive findings on diagnostic imaging of breast: Secondary | ICD-10-CM | POA: Diagnosis present

## 2016-09-26 ENCOUNTER — Ambulatory Visit: Payer: Managed Care, Other (non HMO) | Admitting: Internal Medicine

## 2016-10-01 ENCOUNTER — Ambulatory Visit (INDEPENDENT_AMBULATORY_CARE_PROVIDER_SITE_OTHER): Payer: Managed Care, Other (non HMO) | Admitting: Internal Medicine

## 2016-10-01 ENCOUNTER — Other Ambulatory Visit
Admission: RE | Admit: 2016-10-01 | Discharge: 2016-10-01 | Disposition: A | Discharge status: Home / Self Care | Payer: Managed Care, Other (non HMO) | Source: Ambulatory Visit | Attending: Internal Medicine | Admitting: Internal Medicine

## 2016-10-01 ENCOUNTER — Encounter: Payer: Self-pay | Admitting: Internal Medicine

## 2016-10-01 VITALS — BP 136/88 | HR 74 | Ht 62.0 in | Wt 208.4 lb

## 2016-10-01 DIAGNOSIS — E782 Mixed hyperlipidemia: Secondary | ICD-10-CM

## 2016-10-01 DIAGNOSIS — M62838 Other muscle spasm: Secondary | ICD-10-CM | POA: Diagnosis not present

## 2016-10-01 DIAGNOSIS — I10 Essential (primary) hypertension: Secondary | ICD-10-CM

## 2016-10-01 DIAGNOSIS — K219 Gastro-esophageal reflux disease without esophagitis: Secondary | ICD-10-CM | POA: Diagnosis not present

## 2016-10-01 DIAGNOSIS — R739 Hyperglycemia, unspecified: Secondary | ICD-10-CM | POA: Diagnosis present

## 2016-10-01 LAB — CBC WITH DIFFERENTIAL/PLATELET
BASOS ABS: 0.1 10*3/uL (ref 0–0.1)
BASOS PCT: 1 %
EOS ABS: 0.2 10*3/uL (ref 0–0.7)
EOS PCT: 3 %
HCT: 42.8 % (ref 35.0–47.0)
Hemoglobin: 14.4 g/dL (ref 12.0–16.0)
Lymphocytes Relative: 29 %
Lymphs Abs: 2.3 10*3/uL (ref 1.0–3.6)
MCH: 33.4 pg (ref 26.0–34.0)
MCHC: 33.6 g/dL (ref 32.0–36.0)
MCV: 99.3 fL (ref 80.0–100.0)
Monocytes Absolute: 0.7 10*3/uL (ref 0.2–0.9)
Monocytes Relative: 9 %
Neutro Abs: 4.7 10*3/uL (ref 1.4–6.5)
Neutrophils Relative %: 58 %
PLATELETS: 301 10*3/uL (ref 150–440)
RBC: 4.31 MIL/uL (ref 3.80–5.20)
RDW: 13.7 % (ref 11.5–14.5)
WBC: 8 10*3/uL (ref 3.6–11.0)

## 2016-10-01 LAB — COMPREHENSIVE METABOLIC PANEL
ALBUMIN: 4 g/dL (ref 3.5–5.0)
ALK PHOS: 88 U/L (ref 38–126)
ALT: 21 U/L (ref 14–54)
ANION GAP: 8 (ref 5–15)
AST: 21 U/L (ref 15–41)
BILIRUBIN TOTAL: 0.3 mg/dL (ref 0.3–1.2)
BUN: 18 mg/dL (ref 6–20)
CALCIUM: 9 mg/dL (ref 8.9–10.3)
CO2: 29 mmol/L (ref 22–32)
Chloride: 101 mmol/L (ref 101–111)
Creatinine, Ser: 0.71 mg/dL (ref 0.44–1.00)
GFR calc Af Amer: >60 mL/min (ref >60)
GLUCOSE: 85 mg/dL (ref 65–99)
POTASSIUM: 4 mmol/L (ref 3.5–5.1)
Sodium: 138 mmol/L (ref 135–145)
TOTAL PROTEIN: 7.4 g/dL (ref 6.5–8.1)

## 2016-10-01 LAB — LIPID PANEL
CHOL/HDL RATIO: 3.2 ratio
Cholesterol: 194 mg/dL (ref 0–200)
HDL: 61 mg/dL (ref >40)
LDL CALC: 98 mg/dL (ref 0–99)
TRIGLYCERIDES: 177 mg/dL — AB (ref <150)
VLDL: 35 mg/dL (ref 0–40)

## 2016-10-01 LAB — TSH: TSH: 2.438 u[IU]/mL (ref 0.350–4.500)

## 2016-10-01 MED ORDER — RISEDRONATE SODIUM 35 MG PO TBEC: 35.0000 mg | DELAYED_RELEASE_TABLET | ORAL | 1 refills | Status: DC

## 2016-10-01 MED ORDER — VALSARTAN-HYDROCHLOROTHIAZIDE 160-12.5 MG PO TABS: 1.0000 | ORAL_TABLET | Freq: Every day | ORAL | 1 refills | Status: DC

## 2016-10-01 MED ORDER — VENLAFAXINE HCL ER 150 MG PO TB24
150.0000 mg | ORAL_TABLET | Freq: Every day | ORAL | 1 refills | Status: DC
Start: 2016-10-01 — End: 2017-04-17

## 2016-10-01 MED ORDER — ORPHENADRINE CITRATE ER 100 MG PO TB12: 100.0000 mg | ORAL_TABLET | Freq: Every evening | ORAL | 1 refills | Status: DC | PRN

## 2016-10-01 MED ORDER — METOPROLOL SUCCINATE ER 100 MG PO TB24: 100.0000 mg | ORAL_TABLET | Freq: Every day | ORAL | 1 refills | Status: DC

## 2016-10-01 MED ORDER — SIMVASTATIN 40 MG PO TABS
40.0000 mg | ORAL_TABLET | Freq: Every day | ORAL | 1 refills | Status: DC
Start: 2016-10-01 — End: 2017-04-17

## 2016-10-01 NOTE — Progress Notes (Signed)
Date:  10/01/2016   Name:  Whitney Long   DOB:  [birth year]   MRN:  [id number]   Chief Complaint: Hypertension Hypertension  This is a chronic problem. The problem is unchanged. The problem is controlled. Pertinent negatives include no chest pain, headaches, neck pain, palpitations or shortness of breath.  Hyperlipidemia  This is a chronic problem. The problem is controlled. Pertinent negatives include no chest pain or shortness of breath. Current antihyperlipidemic treatment includes statins. The current treatment provides significant improvement of lipids.  Gastroesophageal Reflux  She complains of heartburn. She reports no abdominal pain, no chest pain or no wheezing. This is a recurrent problem. The problem occurs occasionally. Pertinent negatives include no fatigue. She has tried a PPI (using nexium only 1-2 times per month) for the symptoms. The treatment provided significant relief.  Muscle spasm of neck/shoulder - started after shoveling snow.  Using heat.  Seen in UC in [place] and given muscle relaxant that helps.  Having intermittent tingling in left arm but no weakness or pain.    Review of Systems  Constitutional: Negative for diaphoresis, fatigue, fever and unexpected weight change.  Respiratory: Negative for chest tightness, shortness of breath and wheezing.   Cardiovascular: Negative for chest pain, palpitations and leg swelling.  Gastrointestinal: Positive for heartburn. Negative for abdominal pain, diarrhea and vomiting.  Genitourinary: Negative for dysuria.  Musculoskeletal: Positive for arthralgias. Negative for neck pain and neck stiffness.  Skin: Negative for rash.  Neurological: Negative for dizziness and headaches.  Hematological: Negative for adenopathy.  Psychiatric/Behavioral: Negative for sleep disturbance.    Patient Active Problem List   Diagnosis Date Noted  . Essential hypertension 05/28/2016  . Environmental and seasonal allergies 05/28/2016  . Mixed  hyperlipidemia 05/28/2016  . 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 Gastroesophageal reflux disease without esophagitis 05/28/2016  . Osteopenia 05/28/2016  . Vitamin B12 deficiency 05/28/2016  . Macrocytosis 05/28/2016  . History of kidney stones 05/28/2016  . Lesion of right lobe of liver 05/28/2016  . 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 Posterior tibial tendinitis 12/08/2015  . Sprain of deltoid ligament of right ankle 12/07/2015  . Stress fracture of right tibia 12/07/2015    Prior to Admission medications   Medication Sig Start Date End Date Taking? Authorizing Provider  esomeprazole (NEXIUM) 20 MG capsule Take 20 mg by mouth daily at 12 noon.   Yes Historical Provider, MD  Estradiol (ESTROGEL TD) Place 0.06 % onto the skin 1 day or 1 dose.   Yes Historical Provider, MD  fluticasone (FLONASE) 50 MCG/ACT nasal spray Place into both nostrils as needed for allergies or rhinitis.   Yes Historical Provider, MD  metoprolol (LOPRESSOR) 50 MG tablet Take by mouth.   Yes Historical Provider, MD  Risedronate Sodium 35 MG TBEC Take by mouth once a week.   Yes Historical Provider, MD  simvastatin (ZOCOR) 40 MG tablet Take by mouth.   Yes Historical Provider, MD  valsartan-hydrochlorothiazide (DIOVAN-HCT) 160-12.5 MG tablet Take by mouth.   Yes Historical Provider, MD  Venlafaxine HCl 150 MG TB24 Take by mouth.   Yes Historical Provider, MD  Calcium Carbonate-Vitamin D (CALCIUM 500/D PO) Take by mouth.    Historical Provider, MD  Cyanocobalamin (VITAMIN B 12 PO) Take by mouth.    Historical Provider, MD    Allergies  Allergen Reactions  . Sulfa Antibiotics Palpitations    Other reaction(s): Headache    Past Surgical History:  Procedure Laterality Date  . ABDOMINAL HYSTERECTOMY  2007  prolapse  . BUNIONECTOMY Left   . COLONOSCOPY  2016  . FRONTAL SINUS EXPLORATION    . GANGLION CYST EXCISION  1978  . RHINOPLASTY      Social History  Substance Use Topics  . Smoking status: Former  Games developermoker  . Smokeless tobacco: Never Used  . Alcohol use No    Medication list has been reviewed and updated.   Physical Exam  Constitutional: She is oriented to person, place, and time. She appears well-developed. No distress.  HENT:  Head: Normocephalic and atraumatic.  Neck: Carotid bruit is not present.  Cardiovascular: Normal rate, regular rhythm and normal heart sounds.   Pulmonary/Chest: Effort normal and breath sounds normal. No respiratory distress. She has no wheezes.  Musculoskeletal: Normal range of motion.       Cervical back: She exhibits spasm (left shoulder). She exhibits normal range of motion, no tenderness and no bony tenderness.  Neurological: She is alert and oriented to person, place, and time. She has normal strength and normal reflexes. No sensory deficit.  Skin: Skin is warm and dry. No rash noted.  Psychiatric: She has a normal mood and affect. Her speech is normal and behavior is normal. Thought content normal.  Nursing note and vitals reviewed.   BP 136/88   Pulse 74   Ht 5\' 2"  (1.575 m)   Wt 208 lb 6.4 oz (94.5 kg)   SpO2 93% Comment: Dark nail polish on.Marland Kitchen.  BMI 38.12 kg/m   Assessment and Plan: 1. Essential hypertension controlled - TSH  2. Gastroesophageal reflux disease without esophagitis Stable, using nexium PRN - CBC with Differential/Platelet  3. Mixed hyperlipidemia On statin therapy - Lipid panel  4. Hyperglycemia Screen for DM - Hemoglobin A1c - Comprehensive metabolic panel  5. Muscle spasm of left shoulder area Heat, ibuprofen as needed   Meds ordered this encounter  Medications  . Venlafaxine HCl 150 MG TB24    Sig: Take 1 tablet (150 mg total) by mouth daily.    Dispense:  90 each    Refill:  1  . simvastatin (ZOCOR) 40 MG tablet    Sig: Take 1 tablet (40 mg total) by mouth daily at 6 PM.    Dispense:  90 tablet    Refill:  1  . Risedronate Sodium 35 MG TBEC    Sig: Take 1 tablet (35 mg total) by mouth once a  week.    Dispense:  12 tablet    Refill:  1  . metoprolol succinate (TOPROL-XL) 100 MG 24 hr tablet    Sig: Take 1 tablet (100 mg total) by mouth daily. Take with or immediately following a meal.    Dispense:  90 tablet    Refill:  1  . valsartan-hydrochlorothiazide (DIOVAN-HCT) 160-12.5 MG tablet    Sig: Take 1 tablet by mouth daily.    Dispense:  90 tablet    Refill:  1  . orphenadrine (NORFLEX) 100 MG tablet    Sig: Take 1 tablet (100 mg total) by mouth at bedtime as needed for muscle spasms.    Dispense:  90 tablet    Refill:  1    Bari EdwardLaura Keilee Denman, MD [facility]  10/01/2016

## 2016-10-02 LAB — HEMOGLOBIN A1C
Hgb A1c MFr Bld: 5.5 % (ref 4.8–5.6)
MEAN PLASMA GLUCOSE: 111 mg/dL

## 2016-12-27 ENCOUNTER — Telehealth: Payer: Self-pay

## 2016-12-27 NOTE — Telephone Encounter (Signed)
Pt called requesting date she last had TDAP vaccine since she has a new grandchild on the way. Informed her 07/30/2012 was her last date and she is cleared until 2024.

## 2017-01-16 ENCOUNTER — Other Ambulatory Visit: Payer: Self-pay | Admitting: Internal Medicine

## 2017-01-16 MED ORDER — ALENDRONATE SODIUM 70 MG PO TABS: 70.0000 mg | ORAL_TABLET | ORAL | 11 refills | Status: DC

## 2017-02-19 ENCOUNTER — Telehealth: Payer: Self-pay

## 2017-02-19 ENCOUNTER — Other Ambulatory Visit: Payer: Self-pay | Admitting: Internal Medicine

## 2017-02-19 MED ORDER — OLMESARTAN MEDOXOMIL-HCTZ 20-12.5 MG PO TABS: 1.0000 | ORAL_TABLET | Freq: Every day | ORAL | 0 refills | Status: DC

## 2017-02-19 NOTE — Telephone Encounter (Signed)
Pt called and left VM stating pharmacy contacted her to request diff Rx for Valsartan/ HCTZ due to recall. Please Advise.

## 2017-02-19 NOTE — Telephone Encounter (Signed)
Changed to olmesartan hct and sent to pharmacy.

## 2017-02-20 NOTE — Telephone Encounter (Signed)
Pt informed of med sent in.

## 2017-03-25 ENCOUNTER — Other Ambulatory Visit: Payer: Self-pay | Admitting: Internal Medicine

## 2017-04-09 ENCOUNTER — Encounter: Payer: Managed Care, Other (non HMO) | Admitting: Internal Medicine

## 2017-04-17 ENCOUNTER — Encounter: Payer: Self-pay | Admitting: Internal Medicine

## 2017-04-17 ENCOUNTER — Ambulatory Visit (INDEPENDENT_AMBULATORY_CARE_PROVIDER_SITE_OTHER): Payer: 59 | Admitting: Internal Medicine

## 2017-04-17 ENCOUNTER — Other Ambulatory Visit: Payer: Self-pay | Admitting: Internal Medicine

## 2017-04-17 VITALS — BP 108/74 | HR 90 | Ht 62.0 in | Wt 206.0 lb

## 2017-04-17 DIAGNOSIS — E782 Mixed hyperlipidemia: Secondary | ICD-10-CM | POA: Diagnosis not present

## 2017-04-17 DIAGNOSIS — Z23 Encounter for immunization: Secondary | ICD-10-CM | POA: Diagnosis not present

## 2017-04-17 DIAGNOSIS — I10 Essential (primary) hypertension: Secondary | ICD-10-CM | POA: Diagnosis not present

## 2017-04-17 DIAGNOSIS — Z1231 Encounter for screening mammogram for malignant neoplasm of breast: Secondary | ICD-10-CM | POA: Diagnosis not present

## 2017-04-17 DIAGNOSIS — K219 Gastro-esophageal reflux disease without esophagitis: Secondary | ICD-10-CM

## 2017-04-17 DIAGNOSIS — Z1239 Encounter for other screening for malignant neoplasm of breast: Secondary | ICD-10-CM

## 2017-04-17 MED ORDER — OLMESARTAN MEDOXOMIL-HCTZ 20-12.5 MG PO TABS: 1.0000 | ORAL_TABLET | Freq: Every day | ORAL | 3 refills | Status: DC

## 2017-04-17 MED ORDER — VENLAFAXINE HCL ER 150 MG PO TB24: 150.0000 mg | ORAL_TABLET | Freq: Every day | ORAL | 3 refills | Status: DC

## 2017-04-17 MED ORDER — SIMVASTATIN 40 MG PO TABS: 40.0000 mg | ORAL_TABLET | Freq: Every day | ORAL | 3 refills | Status: DC

## 2017-04-17 NOTE — Progress Notes (Signed)
Date:  04/17/2017   Name:  Whitney Long   DOB:  02/28/1956   MRN:  [id number]   Chief Complaint: Hypertension Hypertension  This is a chronic problem. The problem is controlled. Pertinent negatives include no chest pain, headaches, palpitations or shortness of breath. Past treatments include angiotensin blockers and diuretics. There are no compliance problems.   Gastroesophageal Reflux  She complains of heartburn. She reports no abdominal pain, no chest pain or no coughing. The problem occurs rarely. Pertinent negatives include no fatigue. She has tried a PPI for the symptoms. The treatment provided significant relief.  Hyperlipidemia  This is a chronic problem. The problem is controlled. Pertinent negatives include no chest pain or shortness of breath. Current antihyperlipidemic treatment includes statins. The current treatment provides significant improvement of lipids. There are no compliance problems.   Depression         This is a chronic problem.  The problem has been resolved since onset.  Associated symptoms include no fatigue, no appetite change and no headaches.  Past treatments include SNRIs - Serotonin and norepinephrine reuptake inhibitors.  Previous treatment provided significant relief.   Lab Results  Component Value Date   CHOL 194 10/01/2016   HDL 61 10/01/2016   LDLCALC 98 10/01/2016   TRIG 177 (H) 10/01/2016   CHOLHDL 3.2 10/01/2016     Review of Systems  Constitutional: Negative for appetite change, fatigue, fever and unexpected weight change.  HENT: Negative for tinnitus and trouble swallowing.   Eyes: Negative for visual disturbance.  Respiratory: Negative for cough, chest tightness and shortness of breath.   Cardiovascular: Negative for chest pain, palpitations and leg swelling.  Gastrointestinal: Positive for heartburn. Negative for abdominal pain.  Genitourinary: Negative for dysuria and hematuria.  Musculoskeletal: Negative for arthralgias.    Neurological: Negative for tremors, numbness and headaches.  Hematological: Negative for adenopathy.  Psychiatric/Behavioral: Positive for depression. Negative for dysphoric mood and sleep disturbance. The patient is not nervous/anxious.     Patient Active Problem List   Diagnosis Date Noted  . Essential hypertension 05/28/2016  . Environmental and seasonal allergies 05/28/2016  . Mixed hyperlipidemia 05/28/2016  . 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 Gastroesophageal reflux disease without esophagitis 05/28/2016  . Osteopenia 05/28/2016  . Vitamin B12 deficiency 05/28/2016  . Macrocytosis 05/28/2016  . History of kidney stones 05/28/2016  . Lesion of right lobe of liver 05/28/2016  . 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 Posterior tibial tendinitis 12/08/2015  . Sprain of deltoid ligament of right ankle 12/07/2015  . Stress fracture of right tibia 12/07/2015    Prior to Admission medications   Medication Sig Start Date End Date Taking? Authorizing Provider  alendronate (FOSAMAX) 70 MG tablet Take 1 tablet (70 mg total) by mouth every 7 (seven) days. Take with a full glass of water on an empty stomach. 01/16/17  Yes Reubin Milan, MD  Calcium Carbonate-Vitamin D (CALCIUM 500/D PO) Take by mouth.   Yes [provider]  Cyanocobalamin (VITAMIN B 12 PO) Take by mouth.   Yes [provider]  esomeprazole (NEXIUM) 20 MG capsule Take 20 mg by mouth daily at 12 noon.   Yes [provider]  Estradiol (ESTROGEL TD) Place 0.06 % onto the skin 1 day or 1 dose.   Yes [provider]  fluticasone (FLONASE) 50 MCG/ACT nasal spray Place into both nostrils as needed for allergies or rhinitis.   Yes [provider]  metoprolol succinate (TOPROL-XL) 100 MG 24 hr  tablet TAKE 1 TABLET (100 MG TOTAL) BY MOUTH DAILY. TAKE WITH OR IMMEDIATELY FOLLOWING A MEAL. 03/25/17  Yes Reubin Milan, MD  olmesartan-hydrochlorothiazide (BENICAR HCT) 20-12.5 MG tablet Take 1  tablet by mouth daily. 02/19/17  Yes Reubin Milan, MD  simvastatin (ZOCOR) 40 MG tablet Take 1 tablet (40 mg total) by mouth daily at 6 PM. 10/01/16  Yes Reubin Milan, MD  tretinoin (RETIN-A) 0.025 % cream Apply topically at bedtime.   Yes [provider]  Venlafaxine HCl 150 MG TB24 Take 1 tablet (150 mg total) by mouth daily. 10/01/16  Yes Reubin Milan, MD    Allergies  Allergen Reactions  . Sulfa Antibiotics Palpitations    Other reaction(s): Headache    Past Surgical History:  Procedure Laterality Date  . ABDOMINAL HYSTERECTOMY  2007   prolapse  . BUNIONECTOMY Left   . COLONOSCOPY  2016  . FRONTAL SINUS EXPLORATION    . GANGLION CYST EXCISION  1978  . RHINOPLASTY      Social History  Substance Use Topics  . Smoking status: Former Games developer  . Smokeless tobacco: Never Used  . Alcohol use No     Medication list has been reviewed and updated.  PHQ 2/9 Scores 04/17/2017  PHQ - 2 Score 0    Physical Exam  Constitutional: She is oriented to person, place, and time. She appears well-developed. No distress.  HENT:  Head: Normocephalic and atraumatic.  Neck: Normal range of motion. Neck supple. Carotid bruit is not present. No thyromegaly present.  Cardiovascular: Normal rate, regular rhythm and normal heart sounds.   Pulmonary/Chest: Effort normal. No respiratory distress. She has no wheezes. She has no rales.  Musculoskeletal: She exhibits no edema or tenderness.  Neurological: She is alert and oriented to person, place, and time.  Skin: Skin is warm and dry. No rash noted.  Psychiatric: She has a normal mood and affect. Her speech is normal and behavior is normal. Thought content normal.  Nursing note and vitals reviewed.   BP 108/74   Pulse 90   Ht  (1.575 m)   Wt 206 lb (93.4 kg)   SpO2 97%   BMI 37.68 kg/m   Assessment and Plan: 1. Essential hypertension Controlled Encouraged healthy diet and regular exercise -  olmesartan-hydrochlorothiazide (BENICAR HCT) 20-12.5 MG tablet; Take 1 tablet by mouth daily.  Dispense: 90 tablet; Refill: 3  2. Gastroesophageal reflux disease without esophagitis On PPI with good control  3. Mixed hyperlipidemia On statin therapy - simvastatin (ZOCOR) 40 MG tablet; Take 1 tablet (40 mg total) by mouth daily at 6 PM.  Dispense: 90 tablet; Refill: 3  4. Need for influenza vaccination - Flu Vaccine QUAD 36+ mos IM  5. Breast cancer screening Schedule at [facility] - MM DIGITAL SCREENING BILATERAL; Future   Meds ordered this encounter  Medications  . simvastatin (ZOCOR) 40 MG tablet    Sig: Take 1 tablet (40 mg total) by mouth daily at 6 PM.    Dispense:  90 tablet    Refill:  3  . Venlafaxine HCl 150 MG TB24    Sig: Take 1 tablet (150 mg total) by mouth daily.    Dispense:  90 each    Refill:  3  . olmesartan-hydrochlorothiazide (BENICAR HCT) 20-12.5 MG tablet    Sig: Take 1 tablet by mouth daily.    Dispense:  90 tablet    Refill:  3    Substitute for valsartan hct  Partially dictated using Animal nutritionist. Any errors are unintentional.  Bari Edward, MD [facility]  04/17/2017

## 2017-04-17 NOTE — Patient Instructions (Signed)

## 2017-04-23 ENCOUNTER — Other Ambulatory Visit: Payer: Self-pay | Admitting: Internal Medicine

## 2017-06-10 ENCOUNTER — Ambulatory Visit: Payer: 59 | Attending: Obstetrics and Gynecology

## 2017-06-10 ENCOUNTER — Other Ambulatory Visit: Payer: Self-pay

## 2017-06-10 DIAGNOSIS — M6281 Muscle weakness (generalized): Secondary | ICD-10-CM | POA: Diagnosis present

## 2017-06-10 DIAGNOSIS — R293 Abnormal posture: Secondary | ICD-10-CM | POA: Insufficient documentation

## 2017-06-10 DIAGNOSIS — N993 Prolapse of vaginal vault after hysterectomy: Secondary | ICD-10-CM | POA: Diagnosis present

## 2017-06-10 NOTE — Therapy (Signed)
[place] [facility] [address] [place], [place], [address] Phone: [phone]   Fax:  [phone]  Physical Therapy Evaluation  Patient Details  Name: Whitney Long MRN: [id number] Date of Birth: 12-15-1955 Referring Provider: Dr. Dalbert GarnetBeasley   Encounter Date: 06/10/2017  PT End of Session - 06/10/17 1339    Visit Number  1    Number of Visits  5    Date for PT Re-Evaluation  08/05/17    PT Start Time  0810    PT Stop Time  0858    PT Time Calculation (min)  48 min    Activity Tolerance  Patient tolerated treatment well    Behavior During Therapy  [facility] for tasks assessed/performed       Past Medical History:  Diagnosis Date  . Anxiety   . GERD (gastroesophageal reflux disease)   . Hypertension   . Osteoporosis     Past Surgical History:  Procedure Laterality Date  . ABDOMINAL HYSTERECTOMY  2007   prolapse  . BUNIONECTOMY Left   . COLONOSCOPY  2016  . FRONTAL SINUS EXPLORATION    . GANGLION CYST EXCISION  1978  . RHINOPLASTY      There were no vitals filed for this visit.   Subjective Assessment - 06/10/17 [id number]    Subjective  Patient has recently started noticing "soft tissue at vaginal opening ~ 3 mo ago only after having BM's, . has not had any IBS bouts recently, 1 BM daily. No issues with bladder. Denies pain in abdomen and LB. Satates she has "flat feet" on both sides.    Pertinent History  Single vaginal delivery in 1972. Hysterectomy in 2007 including both ovaries secondary to uterine prolapse. Ankle sprains bilaterally ~ 3 years ago, some discomfort on her R ankle still.     Patient Stated Goals  Prevent needing a sling, be able to maintain position of bladder.    Currently in Pain?  No/denies    Multiple Pain Sites  No         [facility] PT Assessment - 06/10/17 1325      Assessment   Medical Diagnosis  Bladder Prolapse    Referring Provider  Dr. Dalbert GarnetBeasley    Onset Date/Surgical Date  03/10/17      Precautions   Precautions  None      Restrictions   Weight Bearing Restrictions  No      Balance Screen   Has the patient fallen in the past 6 months  No      Home Environment   Living Environment  Private residence    Living Arrangements  Spouse/significant other    Type of Home  House    Home Access  Stairs to enter    Entrance Stairs-Number of Steps  3    Entrance Stairs-Rails  None    Home Layout  Two level    Alternate Level Stairs-Number of Steps  10    Alternate Level Stairs-Rails  Left      Prior Function   Level of Independence  Independent    Vocation  Full time employment      Observation/Other Assessments   Observations  exaggerated lordosis      Single Leg Stance   Comments  inncreased sway        Posture/Postural Control   Posture Comments  Stands with anteriorly rotated pelvis and pronated feet.       PROM   Overall PROM   Within  functional limits for tasks performed    Overall PROM Comments  Tightness noted in hip flexors B at-5 degrees      Strength   Overall Strength  Within functional limits for tasks performed    Overall Strength Comments  Bilaterally at the hip- IR:4, ER:5, Abd: 4+      Special Tests    Special Tests  -- negative stork test bilaterally             Objective measurements completed on examination: See above findings.    Pelvic Floor Special Questions - 06/10/17 0001    Diastasis Recti  1 finger below sternum    Currently Sexually Active  No    Urinary Leakage  No    Urinary frequency  none    Fecal incontinence  No    Falling out feeling (prolapse)  Yes    Activities that cause feeling of prolapse  having a BM    External Perineal Exam  paradoxical contraction with cough, anterior wall tissue relocates with active contraction    Skin Integrity  Intact    Scar  none    Perineal Body/Introitus   Descended    External Palpation  no tenderness    Prolapse  Anterior Wall ~1 cm above the introitus    Exam Type  Vaginal     Sensation  intact    Palpation  no tenderness    Strength  fair squeeze, definite lift    Strength # of reps  4    Strength # of seconds  10    Tone  normal       [facility] Adult PT Treatment/Exercise - 06/10/17 1325      Self-Care   Self-Care  ADL's;Posture;Lifting    ADL's  posture for toileting    Lifting  use of pelvic brace to decrease intraabdominal pressure forces    Posture  standing with neutral pelvis             PT Education - 06/10/17 0913    Education provided  Yes    Education Details  Structure and function of the PF, Use of squatty potty to decrease pressure with BM, HEP (see Pt. instructions)    Person(s) Educated  Patient    Methods  Explanation;Demonstration;Verbal cues    Comprehension  Verbalized understanding       PT Short Term Goals - 06/10/17 0915      PT SHORT TERM GOAL #1   Title  Pt. will demonstrate appropriate PF coordination with cough/sneeze to decrease intraabdominal pressure and improve pelvic organ support    Baseline  --    Time  4    Period  Weeks    Status  New    Target Date  07/08/17      PT SHORT TERM GOAL #2   Title  Patient will demonstrate Pelvic brace support and appropriate body mechanics with bending and lifting to decrease intraabdominal pressure and improve pelvic organ support.    Time  4    Period  Weeks    Target Date  07/08/17        PT Long Term Goals - 06/10/17 0921      PT LONG TERM GOAL #1   Title  Patient will describe a reduction of prolapse symptoms of at least 50%.     Time  8    Period  Weeks    Status  New    Target Date  08/05/17  PT LONG TERM GOAL #3   Title  Patient will demonstrate a 4+/ 5 MMT score to demonstrate improved PF strength and improved functional pelvic organ support.    Baseline  3+    Time  8    Period  Weeks    Status  New    Target Date  08/05/17             Plan - 06/10/17 [id number]    Clinical Impression Statement  Patient has decreased support of her  pelvic organs due to weakness of the pelvic floor and poor body mechanics, and poor PF coordination with cough/sneeze. She is able to isolate PF with cueing and will likely make steady improvement with PT directed toward improving PF strength and coordination through functional movements.     History and Personal Factors relevant to plan of care:  Patient is active, currently working, proactive, and responsive to education.    Clinical Presentation  Stable    Clinical Decision Making  Low    Rehab Potential  Good    Clinical Impairments Affecting Rehab Potential  patient asks questions, responds appropriately, and appears actively involved in her own healthcare.     PT Frequency  1x / week    PT Duration  8 weeks    PT Treatment/Interventions  Therapeutic activities;Functional mobility training;Neuromuscular re-education;Balance training;Patient/family education;Manual techniques;Therapeutic exercise;Taping;Scar mobilization;Passive range of motion    PT Next Visit Plan  TA/PF brace training with functional movement. Posture training.     PT Home Exercise Plan  quick-flicks, long-holds.    Consulted and Agree with Plan of Care  Patient       Patient will benefit from skilled therapeutic intervention in order to improve the following deficits and impairments:  Increased fascial restricitons, Improper body mechanics, Decreased coordination, Postural dysfunction, Impaired flexibility, Decreased range of motion  Visit Diagnosis: Muscle weakness (generalized)  Abnormal posture  Prolapse of vaginal vault after hysterectomy     Problem List Patient Active Problem List   Diagnosis Date Noted  . Essential hypertension 05/28/2016  . Environmental and seasonal allergies 05/28/2016  . Mixed hyperlipidemia 05/28/2016  . 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 Gastroesophageal reflux disease without esophagitis 05/28/2016  . Osteopenia 05/28/2016  . Vitamin B12 deficiency 05/28/2016  . Macrocytosis  05/28/2016  . History of kidney stones 05/28/2016  . Lesion of right lobe of liver 05/28/2016  . 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 Posterior tibial tendinitis 12/08/2015  . Sprain of deltoid ligament of right ankle 12/07/2015  . Stress fracture of right tibia 12/07/2015   Cleophus MoltKeeli T. Talen Poser DPT, ATC  Cleophus MoltKeeli T Ashton Belote 06/10/2017, 4:57 PM  [place] [facility] [address] [place], [place], [address] Phone: [phone]   Fax:  [phone]  Name: Whitney Long MRN: [id number] Date of Birth: 1956/02/08

## 2017-06-10 NOTE — Patient Instructions (Signed)
1) Perform 10 quick-contractions 10 times, 3-6 times/day  2) 5x5 sec. long-holds, 3-6 times per day  3) Use a stool or squatty-potty to improve angle for BM with less pressure.

## 2017-06-17 ENCOUNTER — Ambulatory Visit: Payer: 59

## 2017-06-17 DIAGNOSIS — M6281 Muscle weakness (generalized): Secondary | ICD-10-CM | POA: Diagnosis not present

## 2017-06-17 DIAGNOSIS — R293 Abnormal posture: Secondary | ICD-10-CM

## 2017-06-17 DIAGNOSIS — N993 Prolapse of vaginal vault after hysterectomy: Secondary | ICD-10-CM

## 2017-06-17 NOTE — Patient Instructions (Addendum)
    1.) Think about your posture throughout the day and perform a 50/50 pelvic tilt to come into a more pelvic neutral position.  2) When sitting or standing throughout the day use your "pelvic brace" to give support for the pelvic organs.  3) Mini-marches:  Lying on your back with knees bent, pull lower tummy muscles in with pelvic floor to make your "pelvic brace" then bring your foot off the mat ~ 2 inches and alternate  legs. Perform 5-10 on each side or until the muscles get tired.   3) Pelvic tilts: Standing, tuck hips under using lower tummy and glutes then reverse to bring your bottom back. Perform ~10 2-3 times per day.  4) Use an exhale when you pick up something heavy, push a grocery cart, etc. Rather than bear down.   5) Hip flexor stretch below: perform after taking a walk if possible.

## 2017-06-17 NOTE — Therapy (Signed)
[place] [facility] [address] [place], [place], [address] Phone: [phone]   Fax:  [phone]  Physical Therapy Treatment  Patient Details  Name: Whitney Long MRN: [id number] Date of Birth: June 02, 1956 Referring Provider: Dr. Dalbert GarnetBeasley   Encounter Date: 06/17/2017  PT End of Session - 06/17/17 0859    Visit Number  2    Number of Visits  5    Date for PT Re-Evaluation  08/05/17    PT Start Time  0741    PT Stop Time  0836    PT Time Calculation (min)  55 min    Activity Tolerance  Patient tolerated treatment well    Behavior During Therapy  [facility] for tasks assessed/performed       Past Medical History:  Diagnosis Date  . Anxiety   . GERD (gastroesophageal reflux disease)   . Hypertension   . Osteoporosis     Past Surgical History:  Procedure Laterality Date  . ABDOMINAL HYSTERECTOMY  2007   prolapse  . BUNIONECTOMY Left   . COLONOSCOPY  2016  . FRONTAL SINUS EXPLORATION    . GANGLION CYST EXCISION  1978  . RHINOPLASTY      There were no vitals filed for this visit.      Pelvic Floor Physical Therapy Treatment Note  SCREENING  Changes in Medications?: no     SUBJECTIVE  Patient reports: Has been doing her exercises regularly throughout the day. Notices that she has reduced "tissue" feeling when using the squatty potty and does not take as long to have a BM.  Pain update: none  Patient Goals: Decrease prolapse symptoms and prevent further issue.   OBJECTIVE  Changes in: Posture/Observations:  none  Range of Motion/Flexibilty:  Patient responded well to supine hip flexor stretch and reported feeling more mobility in hips afterward.   INTERVENTIONS THIS SESSION: Therepeutic exercise: Taught/performed pelvic tilts in standing and supine hip flexor stretch to improve posture and allow for more functional positioning of the pelvis for optimal recruitment. Reviewed using a pillow under hips  with kegel to encourage lift of pelvic organs if they are feeling low. Neuro Re-Ed: Pelvic brace training, posture ed., educated on exhale to fully engage PF for strengthening.  Self-care: Educated on the soda-can theory of intraabdominal pressure and using exhales to decrease pressure on PF with activity.                        PT Education - 06/17/17 0840    Education provided  Yes    Education Details  Core stability with pelvic brace, decreasing intraabdominal pressure with daily activities. See Pt. instructions.    Person(s) Educated  Patient    Methods  Explanation;Demonstration;Tactile cues;Verbal cues;Handout    Comprehension  Verbalized understanding;Returned demonstration       PT Short Term Goals - 06/10/17 0915      PT SHORT TERM GOAL #1   Title  Pt. will demonstrate appropriate PF coordination with cough/sneeze to decrease intraabdominal pressure and improve pelvic organ support    Baseline  --    Time  4    Period  Weeks    Status  New    Target Date  07/08/17      PT SHORT TERM GOAL #2   Title  Patient will demonstrate Pelvic brace support and appropriate body mechanics with bending and lifting to decrease intraabdominal pressure and improve pelvic organ support.  Time  4    Period  Weeks    Target Date  07/08/17        PT Long Term Goals - 06/10/17 0921      PT LONG TERM GOAL #1   Title  Patient will describe a reduction of prolapse symptoms of at least 50%.     Time  8    Period  Weeks    Status  New    Target Date  08/05/17      PT LONG TERM GOAL #3   Title  Patient will demonstrate a 4+/ 5 MMT score to demonstrate improved PF strength and improved functional pelvic organ support.    Baseline  3+    Time  8    Period  Weeks    Status  New    Target Date  08/05/17            Plan - 06/17/17 0901    Clinical Impression Statement  Patient is progressing appropriately with her HEP and implementing education effectively.  Today's session focused on integrating the use of the pelvic brace with functional movement and improving posture to allow for long-term prevention of further prolapse. Patient demonstrated understanding and good form with all exercises. Patient will continue to improve with skilled PT directed at further strengthening of the core and PF in functional activities.     History and Personal Factors relevant to plan of care:  Works from home, largely sedentary but eager to learn.    Clinical Decision Making  Low    Rehab Potential  Good    Clinical Impairments Affecting Rehab Potential  patient asks questions, responds appropriately, and appears actively involved in her own healthcare.     PT Frequency  1x / week    PT Duration  8 weeks    PT Treatment/Interventions  Therapeutic activities;Functional mobility training;Neuromuscular re-education;Balance training;Patient/family education;Manual techniques;Therapeutic exercise;Taping;Scar mobilization;Passive range of motion    PT Next Visit Plan  tall kneeling with pelvic brace. kneeling twists for psoas lengthening. active lengthening of hip flexors.     PT Home Exercise Plan  quick-flicks, long-holds, mini-marches, standing pelvic tilts, supine hip flexor stretch.     Consulted and Agree with Plan of Care  Patient       Patient will benefit from skilled therapeutic intervention in order to improve the following deficits and impairments:  Increased fascial restricitons, Improper body mechanics, Decreased coordination, Postural dysfunction, Impaired flexibility, Decreased range of motion  Visit Diagnosis: Muscle weakness (generalized)  Abnormal posture  Prolapse of vaginal vault after hysterectomy     Problem List Patient Active Problem List   Diagnosis Date Noted  . Essential hypertension 05/28/2016  . Environmental and seasonal allergies 05/28/2016  . Mixed hyperlipidemia 05/28/2016  . 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 Gastroesophageal  reflux disease without esophagitis 05/28/2016  . Osteopenia 05/28/2016  . Vitamin B12 deficiency 05/28/2016  . Macrocytosis 05/28/2016  . History of kidney stones 05/28/2016  . Lesion of right lobe of liver 05/28/2016  . 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 Posterior tibial tendinitis 12/08/2015  . Sprain of deltoid ligament of right ankle 12/07/2015  . Stress fracture of right tibia 12/07/2015   Cleophus MoltKeeli T. Elif Yonts DPT, ATC  Cleophus MoltKeeli T Travaughn Vue 06/17/2017, 9:28 AM  [place] [facility] [address] [place], [place], [address] Phone: [phone]   Fax:  [phone]  Name: Whitney Long MRN: [id number] Date of Birth: 30-Nov-1955

## 2017-07-01 ENCOUNTER — Ambulatory Visit: Payer: 59 | Attending: Obstetrics and Gynecology

## 2017-07-01 ENCOUNTER — Other Ambulatory Visit: Payer: Self-pay

## 2017-07-01 DIAGNOSIS — N993 Prolapse of vaginal vault after hysterectomy: Secondary | ICD-10-CM | POA: Insufficient documentation

## 2017-07-01 DIAGNOSIS — M6281 Muscle weakness (generalized): Secondary | ICD-10-CM | POA: Insufficient documentation

## 2017-07-01 DIAGNOSIS — R293 Abnormal posture: Secondary | ICD-10-CM | POA: Diagnosis present

## 2017-07-01 NOTE — Therapy (Deleted)
[place] [facility] [address] [place], [place], [address] Phone: [phone]   Fax:  [phone]  Physical Therapy Treatment  Patient Details  Name: Whitney Long MRN: [id number] Date of Birth: June 14, 1956 Referring Provider: Dr. Dalbert GarnetBeasley   Encounter Date: 07/01/2017    Past Medical History:  Diagnosis Date  . Anxiety   . GERD (gastroesophageal reflux disease)   . Hypertension   . Osteoporosis     Past Surgical History:  Procedure Laterality Date  . ABDOMINAL HYSTERECTOMY  2007   prolapse  . BUNIONECTOMY Left   . COLONOSCOPY  2016  . FRONTAL SINUS EXPLORATION    . GANGLION CYST EXCISION  1978  . RHINOPLASTY      There were no vitals filed for this visit.    Pelvic Floor Physical Therapy Treatment Note  SCREENING  Changes in Medications?: ***   Interpreter Needed?: *** Interpreter Agency:  Interpreter Name  Interpreter ID  Patient Declined Interpreter   [facility]    Patient's communication preference:     SUBJECTIVE  Patient reports: Improved hip discomfort with long walking duration. Improved bowel and bladder emptying.   Precautions:  ***  Pain update:  Location of pain: *** Current pain:  ***/10  Max pain:  ***/10 Least pain:  ***/10 Nature of pain: ***  Patient Goals: ***   OBJECTIVE  Changes in: Posture/Observations:    Range of Motion/Flexibilty:    Strength/MMT:  LE MMT:  Pelvic floor:  Abdominal:   Palpation:  Gait Analysis:  INTERVENTIONS THIS SESSION:   Pelvic Floor Outcome Measures: ***  Goals/G codes need updating?: ***                          PT Short Term Goals - 06/10/17 0915      PT SHORT TERM GOAL #1   Title  Pt. will demonstrate appropriate PF coordination with cough/sneeze to decrease intraabdominal pressure and improve pelvic organ support    Baseline  --    Time  4    Period  Weeks    Status  New    Target Date  07/08/17      PT SHORT TERM GOAL #2   Title  Patient will demonstrate Pelvic brace support and appropriate body mechanics with bending and lifting to decrease intraabdominal pressure and improve pelvic organ support.    Time  4    Period  Weeks    Target Date  07/08/17        PT Long Term Goals - 06/10/17 0921      PT LONG TERM GOAL #1   Title  Patient will describe a reduction of prolapse symptoms of at least 50%.     Time  8    Period  Weeks    Status  New    Target Date  08/05/17      PT LONG TERM GOAL #3   Title  Patient will demonstrate a 4+/ 5 MMT score to demonstrate improved PF strength and improved functional pelvic organ support.    Baseline  3+    Time  8    Period  Weeks    Status  New    Target Date  08/05/17              Patient will benefit from skilled therapeutic intervention in order to improve the following deficits and impairments:     Visit Diagnosis: No  diagnosis found.     Problem List Patient Active Problem List   Diagnosis Date Noted  . Essential hypertension 05/28/2016  . Environmental and seasonal allergies 05/28/2016  . Mixed hyperlipidemia 05/28/2016  . 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 Gastroesophageal reflux disease without esophagitis 05/28/2016  . Osteopenia 05/28/2016  . Vitamin B12 deficiency 05/28/2016  . Macrocytosis 05/28/2016  . History of kidney stones 05/28/2016  . Lesion of right lobe of liver 05/28/2016  . 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 Posterior tibial tendinitis 12/08/2015  . Sprain of deltoid ligament of right ankle 12/07/2015  . Stress fracture of right tibia 12/07/2015    Cleophus MoltKeeli T Oluwadamilare Tobler 07/01/2017, 7:36 AM  [facility] [facility] [address] [place], [place], [address] Phone: [phone]   Fax:  [phone]  Name: Whitney Long MRN: [id number] Date of Birth: 1955-11-11

## 2017-07-01 NOTE — Therapy (Signed)
[place] [facility] [address] [place], [place], [address] Phone: [phone]   Fax:  [phone]  Physical Therapy Treatment  Patient Details  Name: Whitney Long Emery MRN: [id number] Date of Birth: [birth year] Referring Provider: Dr. Dalbert GarnetBeasley   Encounter Date: 07/01/2017    Past Medical History:  Diagnosis Date  . Anxiety   . GERD (gastroesophageal reflux disease)   . Hypertension   . Osteoporosis     Past Surgical History:  Procedure Laterality Date  . ABDOMINAL HYSTERECTOMY  2007   prolapse  . BUNIONECTOMY Left   . COLONOSCOPY  2016  . FRONTAL SINUS EXPLORATION    . GANGLION CYST EXCISION  1978  . RHINOPLASTY      There were no vitals filed for this visit.    Pelvic Floor Physical Therapy Treatment Note   SUBJECTIVE  Patient reports: Improved hip discomfort with long walking duration. Improved bowel and bladder emptying.   Patient Goals: Prevent further pelvic organ prolapse   OBJECTIVE  Changes in: Posture/Observations: patient demonstrates improved ability to tuck pelvis under for a more pelvic neutral stance due to hip-flexor lengthening and deep core strengthening    Range of Motion/Flexibilty:  Within-session improvement in hip flexor length   INTERVENTIONS THIS SESSION: Self Care: Patient educated on the "soda-can theory" of intra-abdominal pressure and how to move safely while bending/lifting/pushing/pulling to decrease intra-abdominal pressure and protect the pelvic floor.  Therex: Patient instructed on and practiced tall kneeling, half kneeling, half kneeling with twist for psoas lengthening, squats with 8 lb. Weight with focus on safe form, seated twists for improved thoracic mobility, and weighted walk out/back with cable machine to simulate pushing activities. All exercises were given with express instruction to use pelvic brace and focus on allignment for pelvic floor engagement/ prolapse  protection, posture and functional movement training, and improved core stability. Patient instructed on and practiced using a horizontal foam roller to improve thoracic spine mobility and allow for improved diaphragm/pelvic floor mobility. Manual Therapy: contract-relax 5x5sec. Used for both 2 joint and single joint hip-flexors to improve functional muscle length and ability to attain correct posture for functional exercise/activity.   Total Time: 63 min.  -Patient tolerated treatment well, finding exercises challenging but appropriate as she "broke a sweat" doing them. Patient responded well to contract-relax demonstrating a marked improvement in hip extension ROM within the treatment session. Patient is able to demonstrate prior HEP exercises with good form and notes improvement in Sx. Since last visit.                               PT Short Term Goals - 06/10/17 0915      PT SHORT TERM GOAL #1   Title  Pt. will demonstrate appropriate PF coordination with cough/sneeze to decrease intraabdominal pressure and improve pelvic organ support    Baseline  --    Time  4    Period  Weeks    Status  New    Target Date  07/08/17      PT SHORT TERM GOAL #2   Title  Patient will demonstrate Pelvic brace support and appropriate body mechanics with bending and lifting to decrease intraabdominal pressure and improve pelvic organ support.    Time  4    Period  Weeks    Target Date  07/08/17        PT Long Term Goals - 06/10/17 [phone]  PT LONG TERM GOAL #1   Title  Patient will describe a reduction of prolapse symptoms of at least 50%.     Time  8    Period  Weeks    Status  New    Target Date  08/05/17      PT LONG TERM GOAL #3   Title  Patient will demonstrate a 4+/ 5 MMT score to demonstrate improved PF strength and improved functional pelvic organ support.    Baseline  3+    Time  8    Period  Weeks    Status  New    Target Date  08/05/17               Patient will benefit from skilled therapeutic intervention in order to improve the following deficits and impairments:     Visit Diagnosis: No diagnosis found.     Problem List Patient Active Problem List   Diagnosis Date Noted  . Essential hypertension 05/28/2016  . Environmental and seasonal allergies 05/28/2016  . Mixed hyperlipidemia 05/28/2016  . 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 Gastroesophageal reflux disease without esophagitis 05/28/2016  . Osteopenia 05/28/2016  . Vitamin B12 deficiency 05/28/2016  . Macrocytosis 05/28/2016  . History of kidney stones 05/28/2016  . Lesion of right lobe of liver 05/28/2016  . 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 Posterior tibial tendinitis 12/08/2015  . Sprain of deltoid ligament of right ankle 12/07/2015  . Stress fracture of right tibia 12/07/2015   Cleophus MoltKeeli T. Arvie Bartholomew DPT, ATC Cleophus MoltKeeli T Jaymison Luber 07/01/2017, 7:36 AM  [place] [facility] [address] [place], [place], [address] Phone: [phone]   Fax:  [phone]  Name: Whitney Long Linzy MRN: [id number] Date of Birth: 1956-06-17

## 2017-07-01 NOTE — Patient Instructions (Addendum)
Body Mechanics handout   Tuck the hips under slightly and pull the pelvic floor up and in hold for 5-10 deep breaths. Rest and repeat on the opposite side. Do 2 sets, twice a day       Stepping, forward, to the side, and backward with a slight lunge and then holding for a second in the middle before you place the foot down in-between. Perform 5 on each side 2 times per day.

## 2017-07-10 ENCOUNTER — Ambulatory Visit
Admission: RE | Admit: 2017-07-10 | Discharge: 2017-07-10 | Disposition: A | Discharge status: Home / Self Care | Payer: 59 | Source: Ambulatory Visit | Attending: Internal Medicine | Admitting: Internal Medicine

## 2017-07-10 DIAGNOSIS — Z1239 Encounter for other screening for malignant neoplasm of breast: Secondary | ICD-10-CM

## 2017-07-10 DIAGNOSIS — Z1231 Encounter for screening mammogram for malignant neoplasm of breast: Secondary | ICD-10-CM | POA: Insufficient documentation

## 2017-07-14 ENCOUNTER — Ambulatory Visit (INDEPENDENT_AMBULATORY_CARE_PROVIDER_SITE_OTHER): Payer: 59

## 2017-07-14 ENCOUNTER — Other Ambulatory Visit: Payer: Self-pay

## 2017-07-14 ENCOUNTER — Ambulatory Visit
Admission: EM | Admit: 2017-07-14 | Discharge: 2017-07-14 | Disposition: A | Discharge status: Home / Self Care | Payer: 59 | Attending: Family Medicine | Admitting: Family Medicine

## 2017-07-14 ENCOUNTER — Encounter: Payer: Self-pay | Admitting: Gynecology

## 2017-07-14 DIAGNOSIS — M25571 Pain in right ankle and joints of right foot: Secondary | ICD-10-CM

## 2017-07-14 DIAGNOSIS — S82831A Other fracture of upper and lower end of right fibula, initial encounter for closed fracture: Secondary | ICD-10-CM | POA: Diagnosis not present

## 2017-07-14 DIAGNOSIS — S8262XA Displaced fracture of lateral malleolus of left fibula, initial encounter for closed fracture: Secondary | ICD-10-CM

## 2017-07-14 DIAGNOSIS — W19XXXA Unspecified fall, initial encounter: Secondary | ICD-10-CM | POA: Diagnosis not present

## 2017-07-14 HISTORY — DX: Hyperlipidemia, unspecified: E78.5

## 2017-07-14 MED ORDER — HYDROCODONE-ACETAMINOPHEN 5-325 MG PO TABS: 1.0000 | ORAL_TABLET | Freq: Three times a day (TID) | ORAL | 0 refills | Status: DC | PRN

## 2017-07-14 NOTE — Discharge Instructions (Signed)
Take medication as prescribed. Take over-the-counter ibuprofen as needed.  Apply ice.  Elevate.  Keep in splint and use crutches.  No weightbearing.  Call tomorrow to schedule follow-up with orthopedic for this week. See above.  Follow up with your primary care physician this week as needed. Return to Urgent care for new or worsening concerns.

## 2017-07-14 NOTE — ED Notes (Signed)
Right leg ortho glass splint applied and checked by Mardella LaymanLindsey NP. PMS intact post application

## 2017-07-14 NOTE — ED Triage Notes (Signed)
Per patient taking out plants at home from the garage to the patio when she slipped and twisted her left ankle x today.

## 2017-07-14 NOTE — ED Provider Notes (Signed)
[facility] ____________________________________________  Time seen: Approximately 115 PM  I have reviewed the triage vital signs and the nursing notes.   HISTORY  Chief Complaint Foot Pain   HPI Whitney Long is a 61 y.o. female presenting for evaluation of right ankle pain and swelling post injury earlier today.  Patient reports that she was carrying some flowers and accidentally misstepped on a very small step causing her to roll her ankle and fall.  States only injury was to right ankle and right lower leg, denies any other pain or injury.  Denies head injury or loss of conscious.  Reports has been unable to tolerate weight to right leg since.  States pain is mild to moderate currently, moderate with direct palpation and attempted ambulating.  Denies paresthesias, pain radiation or other complaints.  No over-the-counter medications taken prior to arrival.  Denies other aggravating or alleviating factors.  Reports has broken her right distal tibia previously and multiple sprains of the ankle, denies other trauma to that area.  Reports otherwise feels well. Denies recent sickness. Denies recent antibiotic use.   Reubin Milan, MD: PCP   Past Medical History:  Diagnosis Date  . Anxiety   . GERD (gastroesophageal reflux disease)   . Hyperlipemia   . Hypertension   . Osteoporosis     Patient Active Problem List   Diagnosis Date Noted  . Essential hypertension 05/28/2016  . Environmental and seasonal allergies 05/28/2016  . Mixed hyperlipidemia 05/28/2016  . 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 Gastroesophageal reflux disease without esophagitis 05/28/2016  . Osteopenia 05/28/2016  . Vitamin B12 deficiency 05/28/2016  . Macrocytosis 05/28/2016  . History of kidney stones 05/28/2016  . Lesion of right lobe of liver 05/28/2016  . 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 Posterior tibial tendinitis 12/08/2015  . Sprain of deltoid ligament of right ankle 12/07/2015  .  Stress fracture of right tibia 12/07/2015    Past Surgical History:  Procedure Laterality Date  . ABDOMINAL HYSTERECTOMY  2007   prolapse  . BUNIONECTOMY Left   . COLONOSCOPY  2016  . FRONTAL SINUS EXPLORATION    . GANGLION CYST EXCISION  1978  . RHINOPLASTY       No current facility-administered medications for this encounter.   Current Outpatient Medications:  .  alendronate (FOSAMAX) 70 MG tablet, Take 1 tablet (70 mg total) by mouth every 7 (seven) days. Take with a full glass of water on an empty stomach., Disp: 4 tablet, Rfl: 11 .  Calcium Carbonate-Vitamin D (CALCIUM 500/D PO), Take by mouth., Disp: , Rfl:  .  Cyanocobalamin (VITAMIN B 12 PO), Take by mouth., Disp: , Rfl:  .  esomeprazole (NEXIUM) 20 MG capsule, Take 20 mg by mouth daily at 12 noon., Disp: , Rfl:  .  metoprolol succinate (TOPROL-XL) 100 MG 24 hr tablet, TAKE 1 TABLET (100 MG TOTAL) BY MOUTH DAILY. TAKE WITH OR IMMEDIATELY FOLLOWING A MEAL., Disp: 90 tablet, Rfl: 1 .  olmesartan-hydrochlorothiazide (BENICAR HCT) 20-12.5 MG tablet, Take 1 tablet by mouth daily., Disp: 90 tablet, Rfl: 3 .  simvastatin (ZOCOR) 40 MG tablet, Take 1 tablet (40 mg total) by mouth daily at 6 PM., Disp: 90 tablet, Rfl: 3 .  tretinoin (RETIN-A) 0.025 % cream, Apply topically at bedtime., Disp: , Rfl:  .  Venlafaxine HCl 150 MG TB24, Take 1 tablet (150 mg total) by mouth daily., Disp: 90 each, Rfl: 3 .  HYDROcodone-acetaminophen (NORCO/VICODIN) 5-325 MG tablet, Take 1 tablet by mouth every  8 (eight) hours as needed for moderate pain or severe pain (do not drive while taking as can cause drowsiness)., Disp: 9 tablet, Rfl: 0  Allergies Sulfa antibiotics  Family History  Problem Relation Age of Onset  . Diabetes Mother   . Hyperlipidemia Mother   . Glaucoma Mother   . Prostate cancer Father   . Hypertension Father   . Hyperlipidemia Father   . Stroke Father        quad bypass  . Thyroid cancer Sister   . Hyperlipidemia Sister   .  Hypertension Sister   . Skin cancer Sister   . Breast cancer Neg Hx     Social History Social History   Tobacco Use  . Smoking status: Former Games developermoker  . Smokeless tobacco: Never Used  Substance Use Topics  . Alcohol use: No  . Drug use: No    Review of Systems Constitutional: No fever/chills Cardiovascular: Denies chest pain. Respiratory: Denies shortness of breath. Gastrointestinal: No abdominal pain.   Musculoskeletal: Negative for back pain. AS above. Skin: Negative for rash.  ____________________________________________   PHYSICAL EXAM:  VITAL SIGNS: ED Triage Vitals  Enc Vitals Group     BP 07/14/17 1233 130/71     Pulse Rate 07/14/17 1233 77     Resp 07/14/17 1233 16     Temp 07/14/17 1233 98.5 F (36.9 C)     Temp Source 07/14/17 1233 Oral     SpO2 07/14/17 1233 97 %     Weight 07/14/17 1229 200 lb (90.7 kg)     Height 07/14/17 1229 5\' 2"  (1.575 m)     Head Circumference --      Peak Flow --      Pain Score 07/14/17 1439 7     Pain Loc --      Pain Edu? --      Excl. in GC? --     Constitutional: Alert and oriented. Well appearing and in no acute distress. Cardiovascular: Normal rate, regular rhythm. Grossly normal heart sounds.  Good peripheral circulation. Respiratory: Normal respiratory effort without tachypnea nor retractions. Breath sounds are clear and equal bilaterally. No wheezes, rales, rhonchi. Musculoskeletal:   No midline cervical, thoracic or lumbar tenderness to palpation. Bilateral pedal pulses equal and easily palpated. Except: Right distal fibula and lateral malleolus moderate tenderness to direct palpation and moderate edema present, mild medial malleolar tenderness and mild edema present, no ecchymosis, skin intact, no proximal tibial or fibula tenderness, right lower extremity otherwise nontender, no ankle rotation and range of motion very limited, normal distal sensation and capillary refill to toes.  Gait not tested due to  pain. Neurologic:  Normal speech and language. Speech is normal.  Skin:  Skin is warm, dry and intact. No rash noted. Psychiatric: Mood and affect are normal. Speech and behavior are normal. Patient exhibits appropriate insight and judgment   ___________________________________________   LABS (all labs ordered are listed, but only abnormal results are displayed)  Labs Reviewed - No data to display ____________________________________________  RADIOLOGY  Dg Tibia/fibula Right  Result Date: 07/14/2017 CLINICAL DATA:  Rolled right ankle.  Pain. EXAM: RIGHT TIBIA AND FIBULA - 2 VIEW COMPARISON:  Right ankle series performed today FINDINGS: Distal fibular fracture noted, better seen on today's ankle series. No acute tibial abnormality. IMPRESSION: Distal fibular fracture, better seen on today's ankle series. Electronically Signed   By: Charlett NoseKevin  Dover M.D.   On: 07/14/2017 14:07   Dg Ankle Complete Right  Result Date:  07/14/2017 CLINICAL DATA:  Rolled right ankle.  Old right ankle fracture EXAM: RIGHT ANKLE - COMPLETE 3+ VIEW COMPARISON:  None. FINDINGS: Diffuse soft tissue swelling, most pronounced laterally. There is a mildly displaced fracture through the distal fibula/ lateral malleolus. No visible acute tibial fracture. Probable old fracture at the medial malleolus. IMPRESSION: Mildly displaced distal fibular/ lateral malleolar fracture. Electronically Signed   By: Charlett NoseKevin  Dover M.D.   On: 07/14/2017 14:07   ____________________________________________   PROCEDURES Procedures    INITIAL IMPRESSION / ASSESSMENT AND PLAN / ED COURSE  Pertinent labs & imaging results that were available during my care of the patient were reviewed by me and considered in my medical decision making (see chart for details).  Very well-appearing patient.  No acute distress.  Presenting for evaluation of right ankle pain post mechanical injury at home today.  Right ankle x-ray and right tib-fib x-ray results  as above, per radiologist mildly displaced distal fibular lateral malleolar fracture.  A posterior and stirrup OCL splint applied as well as crutches given.  Was directed to elevate, apply ice and to remain non-weightbearing.  Follow-up with orthopedic this week in 3-4 days.  Call tomorrow to schedule appointment.  Over-the-counter ibuprofen as needed and as needed Norco as needed for breakthrough pain.  Discussed strict follow-up and return parameters.  Alice RiegerOrth information given.Discussed indication, risks and benefits of medications with patient.   [place] reviewed for last one year, and most recent controlled medications documented09/25/2018 ydrocodone-Acetamin 5-325 MG.   Discussed follow up and return parameters including no resolution or any worsening concerns. Patient verbalized understanding and agreed to plan.   ____________________________________________   FINAL CLINICAL IMPRESSION(S) / ED DIAGNOSES  Final diagnoses:  Displaced fracture of distal end of right fibula  Acute right ankle pain     ED Discharge Orders        Ordered    HYDROcodone-acetaminophen (NORCO/VICODIN) 5-325 MG tablet  Every 8 hours PRN     07/14/17 1428       Note: This dictation was prepared with Dragon dictation along with smaller phrase technology. Any transcriptional errors that result from this process are unintentional.         Renford DillsMiller, Jackee Glasner, NP 07/14/17 1800

## 2017-07-22 ENCOUNTER — Ambulatory Visit: Payer: 59

## 2017-09-26 ENCOUNTER — Other Ambulatory Visit: Payer: Self-pay | Admitting: Internal Medicine

## 2017-09-26 MED ORDER — VENLAFAXINE HCL ER 150 MG PO TB24: 150.0000 mg | ORAL_TABLET | Freq: Every day | ORAL | 1 refills | Status: DC

## 2017-09-27 ENCOUNTER — Other Ambulatory Visit: Payer: Self-pay | Admitting: Internal Medicine

## 2017-09-27 MED ORDER — METOPROLOL SUCCINATE ER 100 MG PO TB24: 100.0000 mg | ORAL_TABLET | Freq: Every day | ORAL | 1 refills | Status: DC

## 2017-11-05 ENCOUNTER — Ambulatory Visit (INDEPENDENT_AMBULATORY_CARE_PROVIDER_SITE_OTHER): Payer: 59 | Admitting: Internal Medicine

## 2017-11-05 ENCOUNTER — Encounter: Payer: Self-pay | Admitting: Internal Medicine

## 2017-11-05 VITALS — BP 130/78 | HR 113 | Ht 62.0 in | Wt 208.0 lb

## 2017-11-05 DIAGNOSIS — M8580 Other specified disorders of bone density and structure, unspecified site: Secondary | ICD-10-CM

## 2017-11-05 DIAGNOSIS — Z1159 Encounter for screening for other viral diseases: Secondary | ICD-10-CM | POA: Diagnosis not present

## 2017-11-05 DIAGNOSIS — Z0000 Encounter for general adult medical examination without abnormal findings: Secondary | ICD-10-CM | POA: Diagnosis not present

## 2017-11-05 DIAGNOSIS — Z1231 Encounter for screening mammogram for malignant neoplasm of breast: Secondary | ICD-10-CM

## 2017-11-05 DIAGNOSIS — K219 Gastro-esophageal reflux disease without esophagitis: Secondary | ICD-10-CM | POA: Diagnosis not present

## 2017-11-05 DIAGNOSIS — Z1239 Encounter for other screening for malignant neoplasm of breast: Secondary | ICD-10-CM

## 2017-11-05 DIAGNOSIS — E782 Mixed hyperlipidemia: Secondary | ICD-10-CM

## 2017-11-05 DIAGNOSIS — I10 Essential (primary) hypertension: Secondary | ICD-10-CM

## 2017-11-05 LAB — POCT URINALYSIS DIPSTICK
Bilirubin, UA: NEGATIVE
Blood, UA: NEGATIVE
GLUCOSE UA: NEGATIVE
Ketones, UA: NEGATIVE
LEUKOCYTES UA: NEGATIVE
NITRITE UA: NEGATIVE
PROTEIN UA: NEGATIVE
Spec Grav, UA: 1.025 (ref 1.010–1.025)
Urobilinogen, UA: 0.2 E.U./dL
pH, UA: 6 (ref 5.0–8.0)

## 2017-11-05 NOTE — Patient Instructions (Signed)
Calcium 1200 mg per day Vitamin D 400-800 IU per day

## 2017-11-05 NOTE — Progress Notes (Signed)
Date:  11/05/2017   Name:  Whitney Long   DOB:  1955-12-14   MRN:  [id number]   Chief Complaint: Annual Exam (Breast Exam. ) Whitney Long is a 62 y.o. female who presents today for her Complete Annual Exam. She feels well. She reports exercising with PT. She reports she is sleeping fairly well. Mammogram and Colonoscopy is up to date. She is recovering from right ankle fracture in December - still in PT and doing well.   Hypertension  This is a chronic problem. The problem is unchanged. The problem is controlled. Pertinent negatives include no chest pain, headaches or palpitations.  Hyperlipidemia  This is a chronic problem. Pertinent negatives include no chest pain. Current antihyperlipidemic treatment includes statins. There are no compliance problems.   Gastroesophageal Reflux  She complains of heartburn. She reports no abdominal pain, no chest pain, no coughing, no dysphagia, no sore throat or no wheezing. This is a chronic problem. The problem occurs occasionally. Pertinent negatives include no fatigue. She has tried a PPI for the symptoms.  Osteoporosis - on Fosamax and Calcium + D.  No side effects to medications.  She is s/p ankle fracture as above.  last DEXA at [facility] 05/2016.    Review of Systems  Constitutional: Negative for chills, fatigue and fever.  HENT: Negative for sore throat and trouble swallowing.   Eyes: Negative for visual disturbance.  Respiratory: Negative for cough and wheezing.   Cardiovascular: Negative for chest pain and palpitations.  Gastrointestinal: Positive for heartburn. Negative for abdominal pain, blood in stool, constipation, diarrhea and dysphagia.  Genitourinary: Negative for dyspareunia, frequency, hematuria and menstrual problem.  Musculoskeletal: Positive for arthralgias (right ankle).  Skin: Negative for color change and rash.  Allergic/Immunologic: Positive for environmental allergies.  Neurological: Negative for dizziness and  headaches.  Hematological: Negative for adenopathy.  Psychiatric/Behavioral: Negative for sleep disturbance. The patient is not nervous/anxious.     Patient Active Problem List   Diagnosis Date Noted  . Essential hypertension 05/28/2016  . Environmental and seasonal allergies 05/28/2016  . Mixed hyperlipidemia 05/28/2016  . 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 Gastroesophageal reflux disease without esophagitis 05/28/2016  . Osteopenia 05/28/2016  . Vitamin B12 deficiency 05/28/2016  . Macrocytosis 05/28/2016  . History of kidney stones 05/28/2016  . Lesion of right lobe of liver 05/28/2016  . 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 Posterior tibial tendinitis 12/08/2015  . Sprain of deltoid ligament of right ankle 12/07/2015  . Stress fracture of right tibia 12/07/2015    Prior to Admission medications   Medication Sig Start Date End Date Taking? Authorizing Provider  alendronate (FOSAMAX) 70 MG tablet Take 1 tablet (70 mg total) by mouth every 7 (seven) days. Take with a full glass of water on an empty stomach. 01/16/17  Yes Reubin Milan, MD  Calcium Carbonate-Vitamin D (CALCIUM 500/D PO) Take by mouth.   Yes [provider]  esomeprazole (NEXIUM) 20 MG capsule Take 20 mg by mouth daily at 12 noon.   Yes [provider]  HYDROcodone-acetaminophen (NORCO/VICODIN) 5-325 MG tablet Take 1 tablet by mouth every 8 (eight) hours as needed for moderate pain or severe pain (do not drive while taking as can cause drowsiness). 07/14/17  Yes Renford Dills, NP  meloxicam (MOBIC) 15 MG tablet TAKE 1 TABLET BY MOUTH EVERY DAY WITH MEALS 10/25/17  Yes [provider]  metoprolol succinate (TOPROL-XL) 100 MG 24 hr tablet Take 1 tablet (100 mg total) by mouth daily. Take  with or immediately following a meal. 09/27/17  Yes Reubin MilanBerglund, Leonna Schlee H, MD  olmesartan-hydrochlorothiazide (BENICAR HCT) 20-12.5 MG tablet Take 1 tablet by mouth daily. 04/17/17  Yes Reubin MilanBerglund, Sequoyah Ramone H, MD    simvastatin (ZOCOR) 40 MG tablet Take 1 tablet (40 mg total) by mouth daily at 6 PM. 04/17/17  Yes Reubin MilanBerglund, Sangita Zani H, MD  Venlafaxine HCl 150 MG TB24 Take 1 tablet (150 mg total) by mouth daily. 09/26/17  Yes Reubin MilanBerglund, Talita Recht H, MD  Cyanocobalamin (VITAMIN B 12 PO) Take by mouth.    [provider]    Allergies  Allergen Reactions  . Sulfa Antibiotics Palpitations    Other reaction(s): Headache    Past Surgical History:  Procedure Laterality Date  . ABDOMINAL HYSTERECTOMY  2007   prolapse  . BUNIONECTOMY Left   . COLONOSCOPY  2016  . FRONTAL SINUS EXPLORATION    . GANGLION CYST EXCISION  1978  . RHINOPLASTY      Social History   Tobacco Use  . Smoking status: Former Games developermoker  . Smokeless tobacco: Never Used  Substance Use Topics  . Alcohol use: No  . Drug use: No     Medication list has been reviewed and updated.  PHQ 2/9 Scores 04/17/2017  PHQ - 2 Score 0    Physical Exam  Constitutional: She is oriented to person, place, and time. She appears well-developed and well-nourished. No distress.  HENT:  Head: Normocephalic and atraumatic.  Right Ear: Tympanic membrane and ear canal normal.  Left Ear: Tympanic membrane and ear canal normal.  Nose: Right sinus exhibits no maxillary sinus tenderness. Left sinus exhibits no maxillary sinus tenderness.  Mouth/Throat: Uvula is midline and oropharynx is clear and moist.  Eyes: Conjunctivae and EOM are normal. Right eye exhibits no discharge. Left eye exhibits no discharge. No scleral icterus.  Neck: Normal range of motion. Carotid bruit is not present. No erythema present. No thyromegaly present.  Cardiovascular: Normal rate, regular rhythm, normal heart sounds and normal pulses.  Pulmonary/Chest: Effort normal. No respiratory distress. She has no wheezes. Right breast exhibits no mass, no nipple discharge, no skin change and no tenderness. Left breast exhibits no mass, no nipple discharge, no skin change and no  tenderness.  Abdominal: Soft. Bowel sounds are normal. There is no hepatosplenomegaly. There is no tenderness. There is no CVA tenderness.  Musculoskeletal: Normal range of motion.  Lymphadenopathy:    She has no cervical adenopathy.    She has no axillary adenopathy.  Neurological: She is alert and oriented to person, place, and time. She has normal reflexes. No cranial nerve deficit or sensory deficit.  Skin: Skin is warm, dry and intact. No rash noted.  Psychiatric: She has a normal mood and affect. Her speech is normal and behavior is normal. Thought content normal.  Nursing note and vitals reviewed.   BP 130/78   Pulse (!) 113   Ht 5\' 2"  (1.575 m)   Wt 208 lb (94.3 kg)   SpO2 96%   BMI 38.04 kg/m   Assessment and Plan: 1. Annual physical exam Menopause sx controlled with vanlafaxine - POCT urinalysis dipstick  2. Breast cancer screening Schedule in December  3. Essential hypertension controlled - Comprehensive metabolic panel - TSH  4. Gastroesophageal reflux disease without esophagitis Doing well on PPI - CBC with Differential/Platelet  5. Osteopenia, unspecified location Continue medication Increase calcium to 1200 mg per day with vitamin D  6. Mixed hyperlipidemia On statin therapy - Lipid panel  7.  Need for hepatitis C screening test - Hepatitis C antibody   No orders of the defined types were placed in this encounter.   Partially dictated using Animal nutritionist. Any errors are unintentional.  Bari Edward, MD [facility]  11/05/2017

## 2017-11-12 ENCOUNTER — Other Ambulatory Visit: Payer: Self-pay | Admitting: Internal Medicine

## 2017-11-17 LAB — LIPID PANEL
CHOLESTEROL: 197 (ref 0–200)
HDL: 66 (ref 35–70)
LDL Cholesterol: 101
Triglycerides: 182 — AB (ref 40–160)

## 2017-11-17 LAB — HEPATIC FUNCTION PANEL
ALT: 20 (ref 7–35)
AST: 20 (ref 13–35)

## 2017-11-17 LAB — BASIC METABOLIC PANEL
BUN: 14 (ref 4–21)
Creatinine: 0.8 (ref 0.5–1.1)
Glucose: 93

## 2017-11-17 LAB — CBC AND DIFFERENTIAL
HEMATOCRIT: 42 (ref 36–46)
Hemoglobin: 14.5 (ref 12.0–16.0)
Platelets: 316 (ref 150–399)
WBC: 7.4

## 2017-11-17 LAB — TSH: TSH: 2.3 (ref 0.41–5.90)

## 2017-11-17 LAB — HM HEPATITIS C SCREENING LAB: HM Hepatitis Screen: NEGATIVE

## 2017-11-18 ENCOUNTER — Encounter: Payer: Self-pay | Admitting: Internal Medicine

## 2018-03-30 ENCOUNTER — Other Ambulatory Visit: Payer: Self-pay | Admitting: Internal Medicine

## 2018-04-21 ENCOUNTER — Other Ambulatory Visit: Payer: Self-pay | Admitting: Internal Medicine

## 2018-04-23 IMAGING — MG MM DIGITAL SCREENING BILAT W/ TOMO W/ CAD
8 of 12 series · 8 of 28 positions shown · non-contrast
Comparison: Previous exam(s).

CLINICAL DATA: Screening.

EXAM:
2D DIGITAL SCREENING BILATERAL MAMMOGRAM WITH CAD AND ADJUNCT TOMO

[L CC synth-2D]
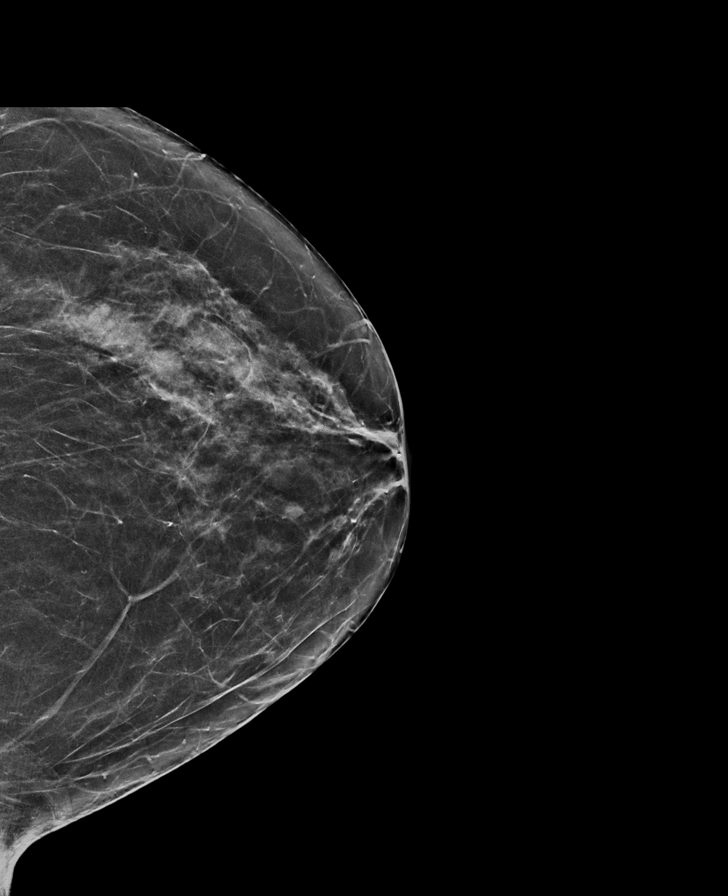

[R MLO synth-2D]
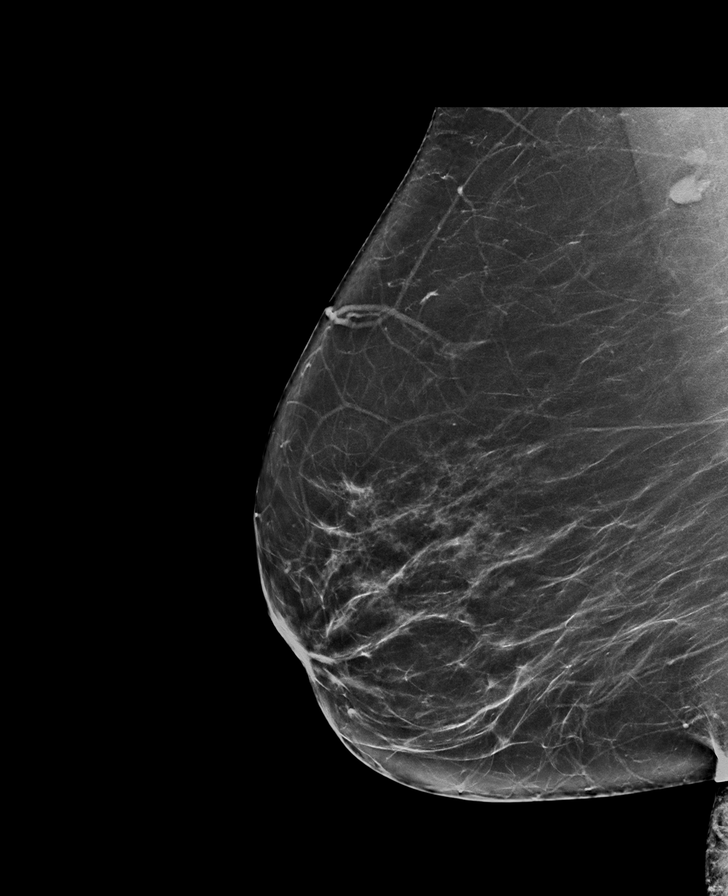

[R CC]
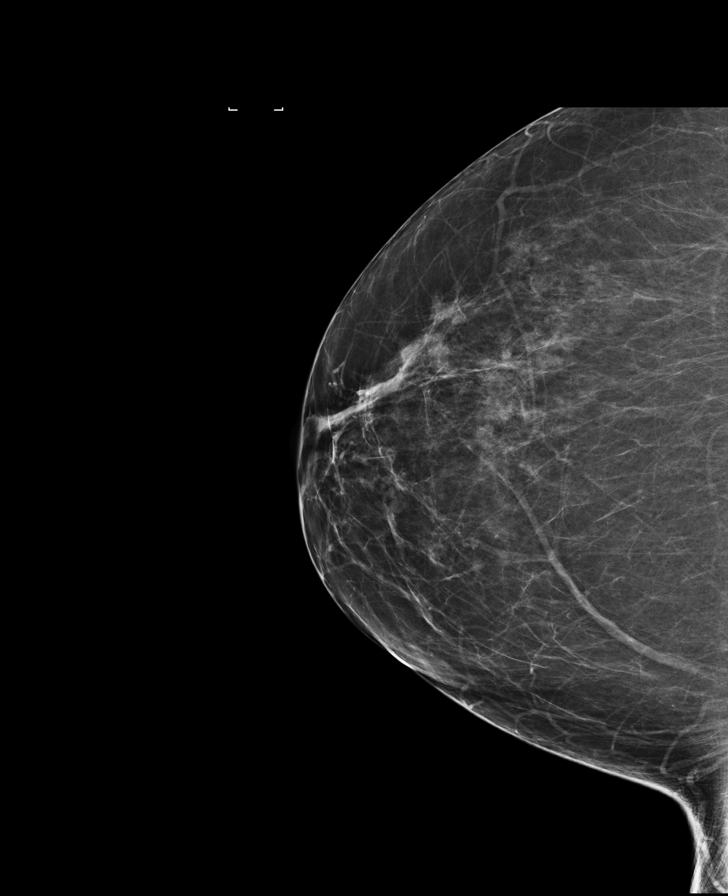

[L MLO]
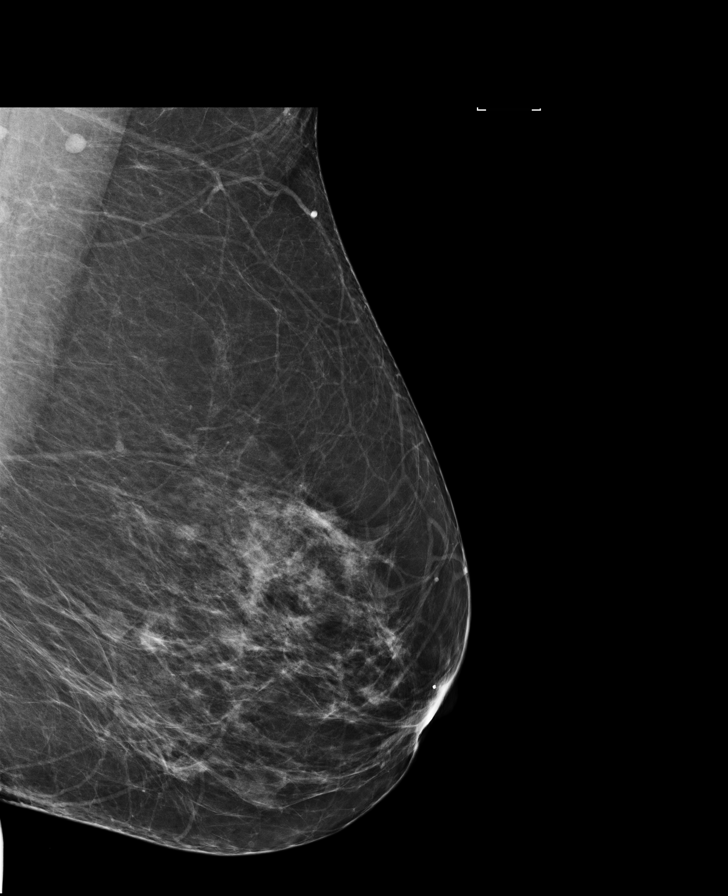

[L MLO synth-2D]
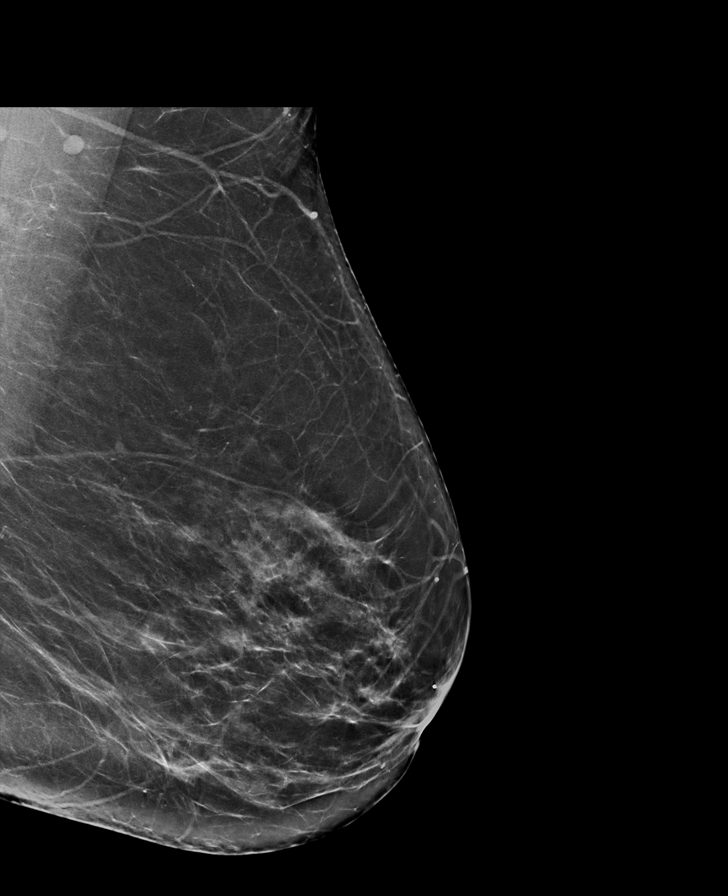

[L CC]
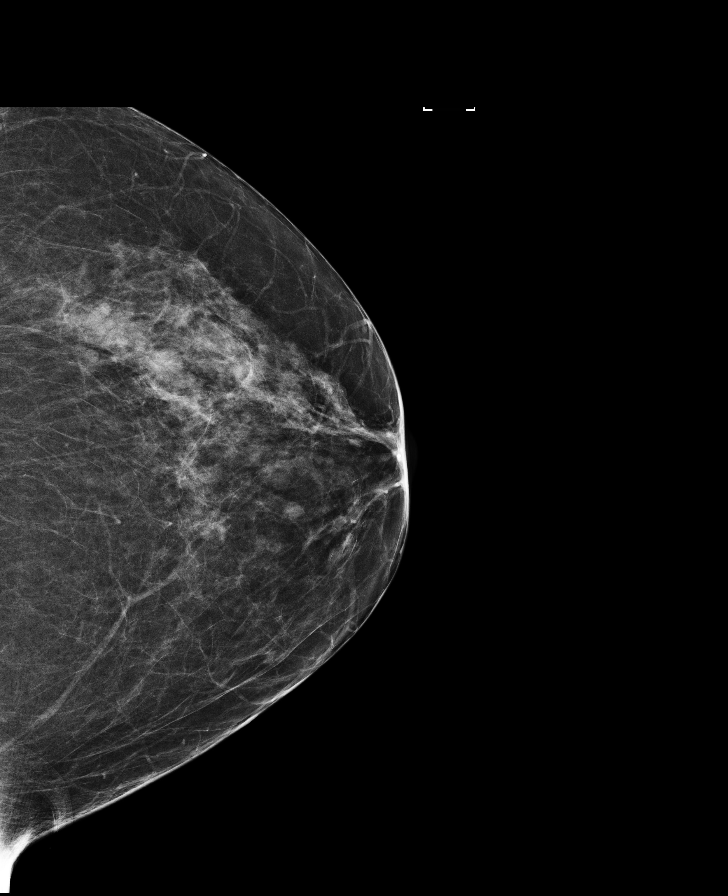

[R CC synth-2D]
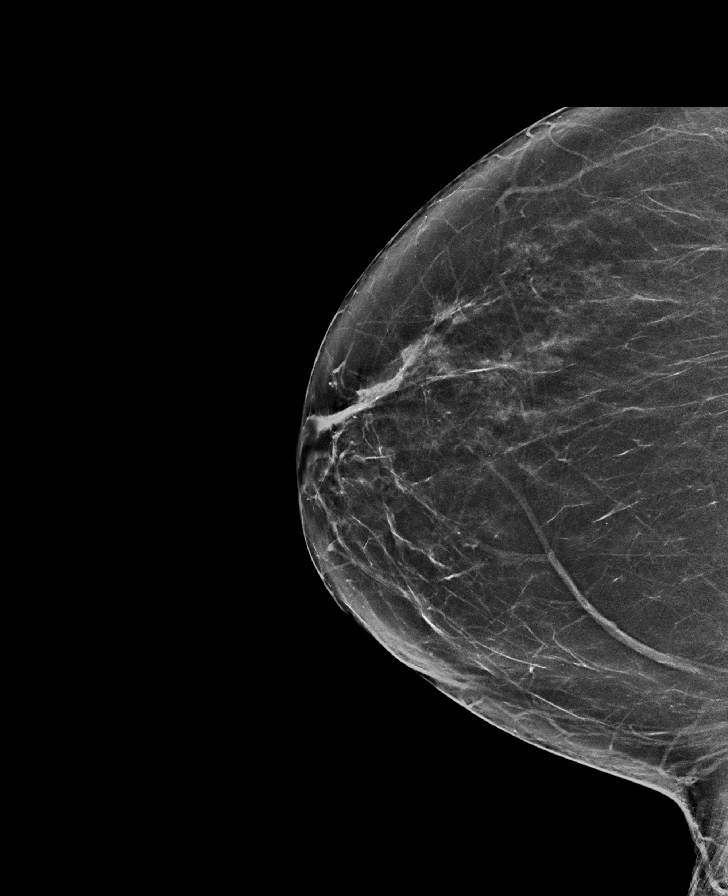

[R MLO]
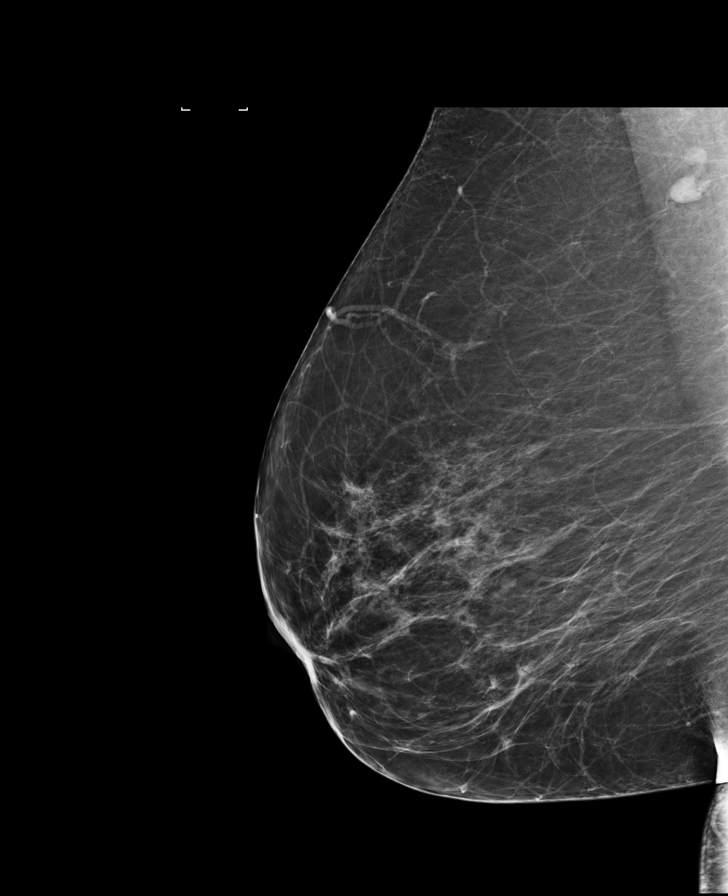

[8 of 28 positions shown; findings below may reference images not displayed]

ACR Breast Density Category b: There are scattered areas of
fibroglandular density.
FINDINGS: There are no findings suspicious for malignancy. Waxing and waning
circumscribed equal density masses are consistent with a changing
cystic pattern. Images were processed with CAD.
IMPRESSION: No mammographic evidence of malignancy. A result letter of this
screening mammogram will be mailed directly to the patient.

RECOMMENDATION:
Screening mammogram in one year. (Code:[id number])

BI-RADS CATEGORY  2: Benign.

## 2018-05-07 ENCOUNTER — Ambulatory Visit (INDEPENDENT_AMBULATORY_CARE_PROVIDER_SITE_OTHER): Payer: 59 | Admitting: Internal Medicine

## 2018-05-07 ENCOUNTER — Encounter: Payer: Self-pay | Admitting: Internal Medicine

## 2018-05-07 VITALS — BP 114/78 | HR 84 | Ht 62.0 in | Wt 207.0 lb

## 2018-05-07 DIAGNOSIS — Z23 Encounter for immunization: Secondary | ICD-10-CM

## 2018-05-07 DIAGNOSIS — I10 Essential (primary) hypertension: Secondary | ICD-10-CM

## 2018-05-07 DIAGNOSIS — N951 Menopausal and female climacteric states: Secondary | ICD-10-CM

## 2018-05-07 DIAGNOSIS — E782 Mixed hyperlipidemia: Secondary | ICD-10-CM

## 2018-05-07 DIAGNOSIS — Z1231 Encounter for screening mammogram for malignant neoplasm of breast: Secondary | ICD-10-CM

## 2018-05-07 DIAGNOSIS — H9193 Unspecified hearing loss, bilateral: Secondary | ICD-10-CM | POA: Diagnosis not present

## 2018-05-07 MED ORDER — SIMVASTATIN 40 MG PO TABS: 40.0000 mg | ORAL_TABLET | Freq: Every day | ORAL | 3 refills | Status: DC

## 2018-05-07 MED ORDER — OLMESARTAN MEDOXOMIL-HCTZ 20-12.5 MG PO TABS: 1.0000 | ORAL_TABLET | Freq: Every day | ORAL | 3 refills | Status: DC

## 2018-05-07 MED ORDER — VENLAFAXINE HCL ER 150 MG PO TB24: 150.0000 mg | ORAL_TABLET | Freq: Every day | ORAL | 3 refills | Status: DC

## 2018-05-07 NOTE — Progress Notes (Signed)
Date:  05/07/2018   Name:  Whitney Long   DOB:  28-May-1956   MRN:  [id number]   Chief Complaint: Hypertension   Hypertension  This is a chronic problem. The problem is controlled. Pertinent negatives include no chest pain, headaches, palpitations or shortness of breath. Past treatments include angiotensin blockers, beta blockers and diuretics. The current treatment provides significant improvement.  Hyperlipidemia  This is a chronic problem. Pertinent negatives include no chest pain or shortness of breath. Current antihyperlipidemic treatment includes statins. The current treatment provides significant improvement of lipids.  Menopause sx - doing well on effexor.  Lab Results  Component Value Date   CREATININE 0.8 11/17/2017   BUN 14 11/17/2017   NA 138 10/01/2016   K 4.0 10/01/2016   CL 101 10/01/2016   CO2 29 10/01/2016    Review of Systems  Constitutional: Negative for chills, fatigue and fever.  HENT: Positive for hearing loss. Negative for ear discharge and ear pain.   Respiratory: Negative for choking, shortness of breath and wheezing.   Cardiovascular: Negative for chest pain and palpitations.  Musculoskeletal: Positive for arthralgias (from recent ankle fracture). Negative for gait problem and joint swelling.  Skin: Negative for color change.  Allergic/Immunologic: Negative for environmental allergies.  Neurological: Negative for dizziness and headaches.  Psychiatric/Behavioral: Negative for sleep disturbance.    Patient Active Problem List   Diagnosis Date Noted  . Essential hypertension 05/28/2016  . Environmental and seasonal allergies 05/28/2016  . Mixed hyperlipidemia 05/28/2016  . 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 Gastroesophageal reflux disease without esophagitis 05/28/2016  . Osteopenia 05/28/2016  . Vitamin B12 deficiency 05/28/2016  . Macrocytosis 05/28/2016  . History of kidney stones 05/28/2016  . Lesion of right lobe of liver 05/28/2016  .  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 Posterior tibial tendinitis 12/08/2015  . Sprain of deltoid ligament of right ankle 12/07/2015  . Stress fracture of right tibia 12/07/2015    Allergies  Allergen Reactions  . Sulfa Antibiotics Palpitations    Other reaction(s): Headache    Past Surgical History:  Procedure Laterality Date  . ABDOMINAL HYSTERECTOMY  2007   prolapse  . BUNIONECTOMY Left   . COLONOSCOPY  2016  . FRONTAL SINUS EXPLORATION    . GANGLION CYST EXCISION  1978  . RHINOPLASTY      Social History   Tobacco Use  . Smoking status: Former Games developer  . Smokeless tobacco: Never Used  Substance Use Topics  . Alcohol use: No  . Drug use: No     Medication list has been reviewed and updated.  Current Meds  Medication Sig  . alendronate (FOSAMAX) 70 MG tablet TAKE 1 TABLET BY MOUTH EVERY 7 DAYS WITH FULL GLASS OF WATER ON EMPTY STOMACH  . Calcium Carbonate-Vitamin D (CALCIUM 500/D PO) Take by mouth.  . Cyanocobalamin (VITAMIN B 12 PO) Take by mouth.  . esomeprazole (NEXIUM) 20 MG capsule Take 20 mg by mouth daily at 12 noon.  . meloxicam (MOBIC) 15 MG tablet TAKE 1 TABLET BY MOUTH EVERY DAY WITH MEALS  . metoprolol succinate (TOPROL-XL) 100 MG 24 hr tablet TAKE 1 TABLET (100 MG TOTAL) BY MOUTH DAILY. TAKE WITH OR IMMEDIATELY FOLLOWING A MEAL.  Marland Kitchen olmesartan-hydrochlorothiazide (BENICAR HCT) 20-12.5 MG tablet Take 1 tablet by mouth daily.  . orphenadrine (NORFLEX) 100 MG tablet TAKE 1 TABLET (100 MG TOTAL) BY MOUTH AT BEDTIME AS NEEDED FOR MUSCLE SPASMS.  . simvastatin (ZOCOR) 40 MG tablet Take 1 tablet (40  mg total) by mouth daily at 6 PM.  . Venlafaxine HCl 150 MG TB24 Take 1 tablet (150 mg total) by mouth daily.    PHQ 2/9 Scores 05/07/2018 04/17/2017  PHQ - 2 Score 0 0    Physical Exam  Constitutional: She is oriented to person, place, and time. She appears well-developed. No distress.  HENT:  Head: Normocephalic and atraumatic.  Right Ear: Tympanic membrane and  ear canal normal.  Left Ear: Tympanic membrane and ear canal normal.  Neck: Normal range of motion. Neck supple. Carotid bruit is not present.  Cardiovascular: Normal rate, regular rhythm and normal heart sounds.  Pulmonary/Chest: Effort normal and breath sounds normal. No respiratory distress.  Musculoskeletal: She exhibits no edema or tenderness.  Lymphadenopathy:    She has no cervical adenopathy.  Neurological: She is alert and oriented to person, place, and time.  Skin: Skin is warm and dry. No rash noted. No erythema.  Psychiatric: She has a normal mood and affect. Her behavior is normal. Thought content normal.  Nursing note and vitals reviewed.   BP 114/78 (BP Location: Right Arm, Patient Position: Sitting, Cuff Size: Normal)   Pulse 84   Ht 5\' 2"  (1.575 m)   Wt 207 lb (93.9 kg)   SpO2 98%   BMI 37.86 kg/m   Assessment and Plan: 1. Essential hypertension Controlled, continue current medication - olmesartan-hydrochlorothiazide (BENICAR HCT) 20-12.5 MG tablet; Take 1 tablet by mouth daily.  Dispense: 90 tablet; Refill: 3  2. Mixed hyperlipidemia On statin therapy with good control - simvastatin (ZOCOR) 40 MG tablet; Take 1 tablet (40 mg total) by mouth daily at 6 PM.  Dispense: 90 tablet; Refill: 3  3. Menopause syndrome Doing well; follow up with gyn annually - Venlafaxine HCl 150 MG TB24; Take 1 tablet (150 mg total) by mouth daily.  Dispense: 90 each; Refill: 3  4. Bilateral hearing loss, unspecified hearing loss type - Ambulatory referral to ENT  5. Encounter for screening mammogram for breast cancer - MM 3D SCREEN BREAST BILATERAL; Future   Partially dictated using Animal nutritionist. Any errors are unintentional.  Bari Edward, MD [facility]  05/07/2018

## 2018-06-27 ENCOUNTER — Other Ambulatory Visit: Payer: Self-pay | Admitting: Internal Medicine

## 2018-07-06 ENCOUNTER — Other Ambulatory Visit: Payer: Self-pay | Admitting: Internal Medicine

## 2018-07-06 DIAGNOSIS — I10 Essential (primary) hypertension: Secondary | ICD-10-CM

## 2018-07-09 ENCOUNTER — Other Ambulatory Visit: Payer: Self-pay

## 2018-07-09 DIAGNOSIS — N951 Menopausal and female climacteric states: Secondary | ICD-10-CM

## 2018-07-09 DIAGNOSIS — E782 Mixed hyperlipidemia: Secondary | ICD-10-CM

## 2018-07-09 DIAGNOSIS — I10 Essential (primary) hypertension: Secondary | ICD-10-CM

## 2018-07-09 MED ORDER — VENLAFAXINE HCL ER 150 MG PO TB24: 150.0000 mg | ORAL_TABLET | Freq: Every day | ORAL | 1 refills | Status: DC

## 2018-07-09 MED ORDER — METOPROLOL SUCCINATE ER 100 MG PO TB24: 100.0000 mg | ORAL_TABLET | Freq: Every day | ORAL | 1 refills | Status: DC

## 2018-07-09 MED ORDER — OLMESARTAN MEDOXOMIL-HCTZ 20-12.5 MG PO TABS: 1.0000 | ORAL_TABLET | Freq: Every day | ORAL | 1 refills | Status: DC

## 2018-07-09 MED ORDER — SIMVASTATIN 40 MG PO TABS: 40.0000 mg | ORAL_TABLET | Freq: Every day | ORAL | 1 refills | Status: DC

## 2018-07-14 ENCOUNTER — Ambulatory Visit
Admission: RE | Admit: 2018-07-14 | Discharge: 2018-07-14 | Disposition: A | Discharge status: Home / Self Care | Payer: 59 | Source: Ambulatory Visit | Attending: Internal Medicine | Admitting: Internal Medicine

## 2018-07-14 ENCOUNTER — Other Ambulatory Visit: Payer: Self-pay

## 2018-07-14 DIAGNOSIS — Z1231 Encounter for screening mammogram for malignant neoplasm of breast: Secondary | ICD-10-CM | POA: Insufficient documentation

## 2018-07-14 MED ORDER — VENLAFAXINE HCL ER 150 MG PO CP24: 150.0000 mg | ORAL_CAPSULE | Freq: Every day | ORAL | 1 refills | Status: DC

## 2018-07-21 ENCOUNTER — Other Ambulatory Visit: Payer: Self-pay | Admitting: Internal Medicine

## 2018-11-07 ENCOUNTER — Encounter: Payer: 59 | Admitting: Internal Medicine

## 2018-11-07 ENCOUNTER — Ambulatory Visit (INDEPENDENT_AMBULATORY_CARE_PROVIDER_SITE_OTHER): Payer: 59 | Admitting: Internal Medicine

## 2018-11-07 ENCOUNTER — Encounter: Payer: Self-pay | Admitting: Internal Medicine

## 2018-11-07 ENCOUNTER — Other Ambulatory Visit: Payer: Self-pay

## 2018-11-07 VITALS — BP 104/70 | HR 78 | Ht 62.0 in | Wt 204.0 lb

## 2018-11-07 DIAGNOSIS — Z23 Encounter for immunization: Secondary | ICD-10-CM | POA: Diagnosis not present

## 2018-11-07 DIAGNOSIS — K219 Gastro-esophageal reflux disease without esophagitis: Secondary | ICD-10-CM | POA: Diagnosis not present

## 2018-11-07 DIAGNOSIS — Z1231 Encounter for screening mammogram for malignant neoplasm of breast: Secondary | ICD-10-CM | POA: Diagnosis not present

## 2018-11-07 DIAGNOSIS — E782 Mixed hyperlipidemia: Secondary | ICD-10-CM | POA: Diagnosis not present

## 2018-11-07 DIAGNOSIS — N951 Menopausal and female climacteric states: Secondary | ICD-10-CM | POA: Diagnosis not present

## 2018-11-07 DIAGNOSIS — M5440 Lumbago with sciatica, unspecified side: Secondary | ICD-10-CM

## 2018-11-07 DIAGNOSIS — Z0000 Encounter for general adult medical examination without abnormal findings: Secondary | ICD-10-CM | POA: Diagnosis not present

## 2018-11-07 DIAGNOSIS — I10 Essential (primary) hypertension: Secondary | ICD-10-CM

## 2018-11-07 LAB — POCT URINALYSIS DIPSTICK
Bilirubin, UA: NEGATIVE
Blood, UA: NEGATIVE
Glucose, UA: NEGATIVE
Ketones, UA: NEGATIVE
Leukocytes, UA: NEGATIVE
Nitrite, UA: NEGATIVE
Protein, UA: NEGATIVE
Spec Grav, UA: 1.01 (ref 1.010–1.025)
Urobilinogen, UA: 0.2 E.U./dL
pH, UA: 6 (ref 5.0–8.0)

## 2018-11-07 MED ORDER — MELOXICAM 15 MG PO TABS: 15.0000 mg | ORAL_TABLET | Freq: Every day | ORAL | 1 refills | Status: AC

## 2018-11-07 NOTE — Patient Instructions (Signed)
This information is directly available on the CDC website: https://www.cdc.gov/coronavirus/2019-ncov/if-you-are-sick/steps-when-sick.html    Source:CDC Reference to specific commercial products, manufacturers, companies, or trademarks does not constitute its endorsement or recommendation by the U.S. Government, Department of Health and Human Services, or Centers for Disease Control and Prevention.  

## 2018-11-07 NOTE — Progress Notes (Signed)
Date:  11/07/2018   Name:  Whitney Long   DOB:  10/31/1955   MRN:  [id number]   Chief Complaint: Annual Exam (Breast exam. ) Whitney Long is a 63 y.o. female who presents today for her Complete Annual Exam. She feels fairly well. She reports exercising some walking. She reports she is sleeping fairly well. Sleep interrupted by hip pain. She does not have any issues with medication. She is interested in Shingrix.  Last Mammogram 06/2018 Colonoscopy 2016 Tdap 2014 No pap needed  Hypertension  This is a chronic problem. The problem is controlled. Pertinent negatives include no chest pain, headaches, palpitations or shortness of breath. Past treatments include angiotensin blockers and beta blockers. There is no history of CAD/MI or left ventricular hypertrophy.  Hyperlipidemia  This is a chronic problem. The problem is controlled. Pertinent negatives include no chest pain or shortness of breath. Current antihyperlipidemic treatment includes statins. The current treatment provides significant improvement of lipids.  Gastroesophageal Reflux  She complains of heartburn and wheezing. She reports no abdominal pain, no chest pain or no coughing. This is a recurrent problem. The problem occurs rarely. Pertinent negatives include no fatigue.  Back Pain  This is a recurrent problem. The problem occurs intermittently. The pain is present in the lumbar spine. The pain radiates to the left thigh. The pain is mild. Pertinent negatives include no abdominal pain, chest pain, dysuria, fever or headaches. She has tried NSAIDs and heat for the symptoms. The treatment provided significant relief.   Lab Results  Component Value Date   CREATININE 0.8 11/17/2017   BUN 14 11/17/2017   NA 138 10/01/2016   K 4.0 10/01/2016   CL 101 10/01/2016   CO2 29 10/01/2016   Lab Results  Component Value Date   CHOL 197 11/17/2017   HDL 66 11/17/2017   LDLCALC 101 11/17/2017   TRIG 182 (A) 11/17/2017   CHOLHDL  3.2 10/01/2016     Review of Systems  Constitutional: Negative for chills, fatigue, fever and unexpected weight change.  HENT: Negative for congestion, hearing loss, tinnitus, trouble swallowing and voice change.   Eyes: Negative for visual disturbance.  Respiratory: Positive for wheezing. Negative for cough, chest tightness and shortness of breath.   Cardiovascular: Negative for chest pain, palpitations and leg swelling.  Gastrointestinal: Positive for heartburn. Negative for abdominal pain, constipation, diarrhea and vomiting.  Endocrine: Negative for polydipsia and polyuria.  Genitourinary: Negative for dysuria, frequency, genital sores, vaginal bleeding and vaginal discharge.  Musculoskeletal: Positive for arthralgias (sciatic nerve pain in hip) and back pain. Negative for gait problem and joint swelling.  Skin: Negative for color change and rash.  Allergic/Immunologic: Positive for environmental allergies.  Neurological: Negative for dizziness, tremors, light-headedness and headaches.  Hematological: Negative for adenopathy. Does not bruise/bleed easily.  Psychiatric/Behavioral: Negative for dysphoric mood and sleep disturbance. The patient is not nervous/anxious.     Patient Active Problem List   Diagnosis Date Noted  . Essential hypertension 05/28/2016  . Environmental and seasonal allergies 05/28/2016  . Mixed hyperlipidemia 05/28/2016  . 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 Gastroesophageal reflux disease without esophagitis 05/28/2016  . Osteopenia 05/28/2016  . Vitamin B12 deficiency 05/28/2016  . Macrocytosis 05/28/2016  . History of kidney stones 05/28/2016  . Lesion of right lobe of liver 05/28/2016  . 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 Posterior tibial tendinitis 12/08/2015  . Sprain of deltoid ligament of right ankle 12/07/2015  . Stress fracture of right tibia 12/07/2015  Allergies  Allergen Reactions  . Sulfa Antibiotics Palpitations    Other reaction(s):  Headache    Past Surgical History:  Procedure Laterality Date  . ABDOMINAL HYSTERECTOMY  2007   prolapse  . BUNIONECTOMY Left   . COLONOSCOPY  2016  . FRONTAL SINUS EXPLORATION    . GANGLION CYST EXCISION  1978  . RHINOPLASTY      Social History   Tobacco Use  . Smoking status: Former Smoker    Packs/day: 0.15    Years: 4.00    Pack years: 0.60    Types: Cigarettes    Last attempt to quit: 1978    Years since quitting: 42.3  . Smokeless tobacco: Never Used  Substance Use Topics  . Alcohol use: No  . Drug use: No     Medication list has been reviewed and updated.  Current Meds  Medication Sig  . alendronate (FOSAMAX) 70 MG tablet TAKE 1 TABLET BY MOUTH EVERY 7 DAYS WITH FULL GLASS OF WATER ON EMPTY STOMACH  . Calcium Carbonate-Vitamin D (CALCIUM 500/D PO) Take by mouth.  . Cyanocobalamin (VITAMIN B 12 PO) Take by mouth.  . metoprolol succinate (TOPROL-XL) 100 MG 24 hr tablet Take 1 tablet (100 mg total) by mouth daily. Take with or immediately following a meal.  . olmesartan-hydrochlorothiazide (BENICAR HCT) 20-12.5 MG tablet Take 1 tablet by mouth daily.  . orphenadrine (NORFLEX) 100 MG tablet TAKE 1 TABLET (100 MG TOTAL) BY MOUTH AT BEDTIME AS NEEDED FOR MUSCLE SPASMS.  . simvastatin (ZOCOR) 40 MG tablet Take 1 tablet (40 mg total) by mouth daily at 6 PM.  . venlafaxine XR (EFFEXOR-XR) 150 MG 24 hr capsule Take 1 capsule (150 mg total) by mouth daily with breakfast.    PHQ 2/9 Scores 11/07/2018 05/07/2018 04/17/2017  PHQ - 2 Score 0 0 0    BP Readings from Last 3 Encounters:  11/07/18 104/70  05/07/18 114/78  11/05/17 130/78    Physical Exam Vitals signs and nursing note reviewed.  Constitutional:      General: She is not in acute distress.    Appearance: She is well-developed.  HENT:     Head: Normocephalic and atraumatic.     Right Ear: Tympanic membrane and ear canal normal.     Left Ear: Tympanic membrane and ear canal normal.     Nose:     Right  Sinus: No maxillary sinus tenderness.     Left Sinus: No maxillary sinus tenderness.     Mouth/Throat:     Pharynx: Uvula midline.  Eyes:     General: No scleral icterus.       Right eye: No discharge.        Left eye: No discharge.     Conjunctiva/sclera: Conjunctivae normal.  Neck:     Musculoskeletal: Normal range of motion. No erythema.     Thyroid: No thyromegaly.     Vascular: No carotid bruit.  Cardiovascular:     Rate and Rhythm: Normal rate and regular rhythm.     Pulses: Normal pulses.     Heart sounds: Normal heart sounds.  Pulmonary:     Effort: Pulmonary effort is normal. No respiratory distress.     Breath sounds: No wheezing.  Chest:     Breasts:        Right: No mass, nipple discharge, skin change or tenderness.        Left: No mass, nipple discharge, skin change or tenderness.  Abdominal:  General: Bowel sounds are normal.     Palpations: Abdomen is soft.     Tenderness: There is no abdominal tenderness.  Musculoskeletal:     Left hip: She exhibits tenderness.     Right knee: Normal.     Left knee: Normal.     Right lower leg: No edema.     Left lower leg: No edema.  Lymphadenopathy:     Cervical: No cervical adenopathy.  Skin:    General: Skin is warm and dry.     Findings: No rash.  Neurological:     Mental Status: She is alert and oriented to person, place, and time.     Cranial Nerves: No cranial nerve deficit.     Sensory: No sensory deficit.     Deep Tendon Reflexes: Reflexes are normal and symmetric.  Psychiatric:        Speech: Speech normal.        Behavior: Behavior normal.        Thought Content: Thought content normal.     Wt Readings from Last 3 Encounters:  11/07/18 204 lb (92.5 kg)  05/07/18 207 lb (93.9 kg)  11/05/17 208 lb (94.3 kg)    BP 104/70   Pulse 78   Ht 5\' 2"  (1.575 m)   Wt 204 lb (92.5 kg)   SpO2 94%   BMI 37.31 kg/m   Assessment and Plan: 1. Annual physical exam Normal exam except for weight Work on  diet, exercise - follow up in 6 months - POCT urinalysis dipstick  2. Encounter for screening mammogram for breast cancer Schedule in December - MM 3D SCREEN BREAST BILATERAL; Future  3. Essential hypertension controlled - CBC with Differential/Platelet - Comprehensive metabolic panel - TSH  4. Mixed hyperlipidemia On statin therapy - Lipid panel  5. Gastroesophageal reflux disease without esophagitis Improved, off of PPI now - CBC with Differential/Platelet  6. Menopause syndrome Doing well on Effexor  7. Need for shingles vaccine First dose today - Varicella-zoster vaccine IM  8. Acute right-sided low back pain with sciatica, sciatica laterality unspecified - meloxicam (MOBIC) 15 MG tablet; Take 1 tablet (15 mg total) by mouth daily.  Dispense: 90 tablet; Refill: 1   Partially dictated using Animal nutritionist. Any errors are unintentional.  Bari Edward, MD [facility]  11/07/2018

## 2018-11-08 LAB — CBC WITH DIFFERENTIAL/PLATELET
Basophils Absolute: 0.1 10*3/uL (ref 0.0–0.2)
Basos: 1 %
EOS (ABSOLUTE): 0.3 10*3/uL (ref 0.0–0.4)
Eos: 3 %
Hematocrit: 43 % (ref 34.0–46.6)
Hemoglobin: 14.7 g/dL (ref 11.1–15.9)
Immature Grans (Abs): 0 10*3/uL (ref 0.0–0.1)
Immature Granulocytes: 0 %
Lymphocytes Absolute: 2.5 10*3/uL (ref 0.7–3.1)
Lymphs: 29 %
MCH: 34.3 pg — ABNORMAL HIGH (ref 26.6–33.0)
MCHC: 34.2 g/dL (ref 31.5–35.7)
MCV: 101 fL — ABNORMAL HIGH (ref 79–97)
Monocytes Absolute: 0.7 10*3/uL (ref 0.1–0.9)
Monocytes: 8 %
Neutrophils Absolute: 5.2 10*3/uL (ref 1.4–7.0)
Neutrophils: 59 %
Platelets: 350 10*3/uL (ref 150–450)
RBC: 4.28 x10E6/uL (ref 3.77–5.28)
RDW: 13.3 % (ref 11.7–15.4)
WBC: 8.7 10*3/uL (ref 3.4–10.8)

## 2018-11-08 LAB — COMPREHENSIVE METABOLIC PANEL
ALT: 19 IU/L (ref 0–32)
AST: 18 IU/L (ref 0–40)
Albumin/Globulin Ratio: 1.8 (ref 1.2–2.2)
Albumin: 4.4 g/dL (ref 3.8–4.8)
Alkaline Phosphatase: 100 IU/L (ref 39–117)
BUN/Creatinine Ratio: 19 (ref 12–28)
BUN: 16 mg/dL (ref 8–27)
Bilirubin Total: 0.3 mg/dL (ref 0.0–1.2)
CO2: 22 mmol/L (ref 20–29)
Calcium: 9.7 mg/dL (ref 8.7–10.3)
Chloride: 101 mmol/L (ref 96–106)
Creatinine, Ser: 0.86 mg/dL (ref 0.57–1.00)
GFR calc Af Amer: 83 mL/min/{1.73_m2} (ref >59)
GFR calc non Af Amer: 72 mL/min/{1.73_m2} (ref >59)
Globulin, Total: 2.5 g/dL (ref 1.5–4.5)
Glucose: 99 mg/dL (ref 65–99)
Potassium: 4.4 mmol/L (ref 3.5–5.2)
Sodium: 141 mmol/L (ref 134–144)
Total Protein: 6.9 g/dL (ref 6.0–8.5)

## 2018-11-08 LAB — LIPID PANEL
Chol/HDL Ratio: 3.3 ratio (ref 0.0–4.4)
Cholesterol, Total: 210 mg/dL — ABNORMAL HIGH (ref 100–199)
HDL: 63 mg/dL (ref >39)
LDL Calculated: 113 mg/dL — ABNORMAL HIGH (ref 0–99)
Triglycerides: 171 mg/dL — ABNORMAL HIGH (ref 0–149)
VLDL Cholesterol Cal: 34 mg/dL (ref 5–40)

## 2018-11-08 LAB — TSH: TSH: 2.88 u[IU]/mL (ref 0.450–4.500)

## 2018-12-26 ENCOUNTER — Other Ambulatory Visit: Payer: Self-pay | Admitting: Internal Medicine

## 2018-12-26 DIAGNOSIS — I10 Essential (primary) hypertension: Secondary | ICD-10-CM

## 2018-12-26 DIAGNOSIS — E782 Mixed hyperlipidemia: Secondary | ICD-10-CM

## 2019-02-06 ENCOUNTER — Ambulatory Visit (INDEPENDENT_AMBULATORY_CARE_PROVIDER_SITE_OTHER): Payer: 59

## 2019-02-06 ENCOUNTER — Other Ambulatory Visit: Payer: Self-pay

## 2019-02-06 DIAGNOSIS — Z23 Encounter for immunization: Secondary | ICD-10-CM

## 2019-03-02 ENCOUNTER — Other Ambulatory Visit: Payer: Self-pay | Admitting: Internal Medicine

## 2019-04-24 ENCOUNTER — Encounter: Payer: Self-pay | Admitting: Emergency Medicine

## 2019-04-24 ENCOUNTER — Other Ambulatory Visit: Payer: Self-pay

## 2019-04-24 ENCOUNTER — Ambulatory Visit
Admission: EM | Admit: 2019-04-24 | Discharge: 2019-04-24 | Disposition: A | Discharge status: Home / Self Care | Payer: 59 | Attending: Family Medicine | Admitting: Family Medicine

## 2019-04-24 DIAGNOSIS — W268XXA Contact with other sharp object(s), not elsewhere classified, initial encounter: Secondary | ICD-10-CM

## 2019-04-24 DIAGNOSIS — Z23 Encounter for immunization: Secondary | ICD-10-CM | POA: Diagnosis not present

## 2019-04-24 DIAGNOSIS — S61011A Laceration without foreign body of right thumb without damage to nail, initial encounter: Secondary | ICD-10-CM | POA: Diagnosis not present

## 2019-04-24 MED ORDER — TETANUS-DIPHTH-ACELL PERTUSSIS 5-2.5-18.5 LF-MCG/0.5 IM SUSP
0.5000 mL | Freq: Once | INTRAMUSCULAR | Status: AC
  Administered 2019-04-24: 16:00:00 0.5 mL via INTRAMUSCULAR

## 2019-04-24 MED ORDER — LIDOCAINE-EPINEPHRINE-TETRACAINE (LET) SOLUTION
3.0000 mL | Freq: Once | NASAL | Status: AC
  Administered 2019-04-24: 17:00:00 3 mL via TOPICAL

## 2019-04-24 NOTE — Discharge Instructions (Addendum)
Keep clean and dry. Monitor. Use finger splint for 3 days.   Follow up with your primary care physician this week as needed. Return to Urgent care for new or worsening concerns.

## 2019-04-24 NOTE — ED Triage Notes (Signed)
Pt has laceration on her right thumb. She states she cut her thumb on a can food lid. She started to feel faint so she came to [facility]. She thinks her last TD was about 10 years ago.

## 2019-04-24 NOTE — ED Provider Notes (Signed)
[facility] ____________________________________________  Time seen: Approximately 4:41 PM  I have reviewed the triage vital signs and the nursing notes.   HISTORY  Chief Complaint Laceration  HPI Allee Busk is a 63 y.o. female presenting for evaluation of right thumb laceration that occurred just prior to arrival.  Patient states that she was opening a can of tomato paste and accidentally cut the top of her right thumb.  Reports that she has had bleeding since.  States minimal pain.  Did feel faint earlier but no syncope.  Unsure of last tetanus immunization.  Reports otherwise doing well.  Denies other complaints.  Denies aggravating or alleviating factors.  Right-hand-dominant.  Reubin Milan, MD: PCP    Past Medical History:  Diagnosis Date  . Anxiety   . GERD (gastroesophageal reflux disease)   . Hyperlipemia   . Hypertension   . Osteoporosis     Patient Active Problem List   Diagnosis Date Noted  . Essential hypertension 05/28/2016  . Environmental and seasonal allergies 05/28/2016  . Mixed hyperlipidemia 05/28/2016  . 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 Gastroesophageal reflux disease without esophagitis 05/28/2016  . Osteopenia 05/28/2016  . Vitamin B12 deficiency 05/28/2016  . Macrocytosis 05/28/2016  . History of kidney stones 05/28/2016  . Lesion of right lobe of liver 05/28/2016  . 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 Posterior tibial tendinitis 12/08/2015  . Sprain of deltoid ligament of right ankle 12/07/2015  . Stress fracture of right tibia 12/07/2015    Past Surgical History:  Procedure Laterality Date  . ABDOMINAL HYSTERECTOMY  2007   prolapse  . BUNIONECTOMY Left   . COLONOSCOPY  2016  . FRONTAL SINUS EXPLORATION    . GANGLION CYST EXCISION  1978  . RHINOPLASTY       No current facility-administered medications for this encounter.   Current Outpatient Medications:  .  Calcium Carbonate-Vitamin D (CALCIUM 500/D PO), Take  by mouth., Disp: , Rfl:  .  metoprolol succinate (TOPROL-XL) 100 MG 24 hr tablet, TAKE 1 TABLET DAILY . TAKE WITH OR IMMEDIATELY        FOLLOWING A MEAL., Disp: 90 tablet, Rfl: 1 .  olmesartan-hydrochlorothiazide (BENICAR HCT) 20-12.5 MG tablet, TAKE 1 TABLET DAILY, Disp: 90 tablet, Rfl: 1 .  simvastatin (ZOCOR) 40 MG tablet, TAKE 1 TABLET DAILY AT     6:00PM., Disp: 90 tablet, Rfl: 1 .  venlafaxine XR (EFFEXOR-XR) 150 MG 24 hr capsule, TAKE 1 CAPSULE DAILY WITH  BREAKFAST, Disp: 90 capsule, Rfl: 0 .  alendronate (FOSAMAX) 70 MG tablet, TAKE 1 TABLET BY MOUTH EVERY 7 DAYS WITH FULL GLASS OF WATER ON EMPTY STOMACH, Disp: 12 tablet, Rfl: 3 .  Cyanocobalamin (VITAMIN B 12 PO), Take by mouth., Disp: , Rfl:  .  meloxicam (MOBIC) 15 MG tablet, Take 1 tablet (15 mg total) by mouth daily., Disp: 90 tablet, Rfl: 1 .  orphenadrine (NORFLEX) 100 MG tablet, TAKE 1 TABLET (100 MG TOTAL) BY MOUTH AT BEDTIME AS NEEDED FOR MUSCLE SPASMS., Disp: 90 tablet, Rfl: 0  Allergies Sulfa antibiotics  Family History  Problem Relation Age of Onset  . Diabetes Mother   . Hyperlipidemia Mother   . Glaucoma Mother   . Prostate cancer Father   . Hypertension Father   . Hyperlipidemia Father   . Stroke Father        quad bypass  . Thyroid cancer Sister   . Hyperlipidemia Sister   . Hypertension Sister   . Skin cancer Sister   .  Breast cancer Neg Hx     Social History Social History   Tobacco Use  . Smoking status: Former Smoker    Packs/day: 0.15    Years: 4.00    Pack years: 0.60    Types: Cigarettes    Quit date: 1978    Years since quitting: 42.7  . Smokeless tobacco: Never Used  Substance Use Topics  . Alcohol use: No  . Drug use: No    Review of Systems Constitutional: No fever. Cardiovascular: Denies chest pain. Respiratory: Denies shortness of breath. Gastrointestinal: No abdominal pain. Musculoskeletal: Positive right thumb injury. Skin: Positive laceration   ____________________________________________   PHYSICAL EXAM:  VITAL SIGNS: ED Triage Vitals  Enc Vitals Group     BP 04/24/19 1623 132/83     Pulse Rate 04/24/19 1623 (!) 104     Resp 04/24/19 1623 18     Temp 04/24/19 1623 98.7 F (37.1 C)     Temp Source 04/24/19 1623 Oral     SpO2 04/24/19 1623 99 %     Weight 04/24/19 1620 200 lb (90.7 kg)     Height 04/24/19 1620 5\' 2"  (1.575 m)     Head Circumference --      Peak Flow --      Pain Score 04/24/19 1620 6     Pain Loc --      Pain Edu? --      Excl. in [place]? --     Constitutional: Alert and oriented. Well appearing and in no acute distress. Eyes: Conjunctivae are normal.  ENT      Head: Normocephalic and atraumatic. Cardiovascular: Normal rate, regular rhythm. Grossly normal heart sounds.  Good peripheral circulation. Respiratory: Normal respiratory effort without tachypnea nor retractions. Breath sounds are clear and equal bilaterally. No wheezes, rales, rhonchi. Neurologic:  Normal speech and language.  Speech is normal. No gait instability.  Skin:  Skin is warm, dry.  Except: Right thumb dorsal aspect of DIP joint 2 cm linear superficial laceration present with mild active bleeding, minimal tenderness, no bony tenderness, normal distal sensation capillary refill, good resisted distal flexion and extension, no motor or tendon deficit noted.  Right hand otherwise nontender. Psychiatric: Mood and affect are normal. Speech and behavior are normal. Patient exhibits appropriate insight and judgment   ___________________________________________   LABS (all labs ordered are listed, but only abnormal results are displayed)  Labs Reviewed - No data to display ____________________________________________   PROCEDURES Procedures   Procedure(s) performed:  Procedure explained and verbal consent obtained. Consent: Verbal consent obtained. Written consent not obtained. Risks and benefits: risks, benefits and alternatives  were discussed Patient identity confirmed: verbally with patient and hospital-assigned identification number  Consent given by: patient   Laceration Repair Location: Right dorsal thumb Length: 2 cm Foreign bodies: no foreign bodies Tendon involvement: none Nerve involvement: none Preparation: Patient was prepped and draped in the usual sterile fashion. Anesthesia topical let briefly Cleaned with betadine.  Irrigation solution: sterile water Irrigation method: jet lavage Amount of cleaning: copious Repaired with Dermabond Patient tolerate well. Wound well approximated post repair.  Antibiotic ointment and dressing applied.  Wound care instructions provided.  Observe for any signs of infection or other problems.      INITIAL IMPRESSION / ASSESSMENT AND PLAN / ED COURSE  Pertinent labs & imaging results that were available during my care of the patient were reviewed by me and considered in my medical decision making (see chart for details).  Well-appearing patient.  Right thumb laceration, cleaned and repaired as above.  Patient tolerated well.  Discussed wound care and supportive care.  Finger splint given.  Tetanus immunization updated.  Discussed follow up with Primary care physician this week as needed. Discussed follow up and return parameters including no resolution or any worsening concerns. Patient verbalized understanding and agreed to plan.   ____________________________________________   FINAL CLINICAL IMPRESSION(S) / ED DIAGNOSES  Final diagnoses:  Laceration of right thumb without foreign body without damage to nail, initial encounter     ED Discharge Orders    None       Note: This dictation was prepared with Dragon dictation along with smaller phrase technology. Any transcriptional errors that result from this process are unintentional.         Renford DillsMiller, Katerina Zurn, NP 04/24/19 1710

## 2019-05-12 ENCOUNTER — Encounter: Payer: Self-pay | Admitting: Internal Medicine

## 2019-05-12 ENCOUNTER — Ambulatory Visit (INDEPENDENT_AMBULATORY_CARE_PROVIDER_SITE_OTHER): Payer: 59 | Admitting: Internal Medicine

## 2019-05-12 ENCOUNTER — Other Ambulatory Visit: Payer: Self-pay

## 2019-05-12 VITALS — BP 128/84 | HR 90 | Ht 62.0 in | Wt 199.6 lb

## 2019-05-12 DIAGNOSIS — N951 Menopausal and female climacteric states: Secondary | ICD-10-CM

## 2019-05-12 DIAGNOSIS — I10 Essential (primary) hypertension: Secondary | ICD-10-CM | POA: Diagnosis not present

## 2019-05-12 DIAGNOSIS — M62838 Other muscle spasm: Secondary | ICD-10-CM

## 2019-05-12 DIAGNOSIS — E782 Mixed hyperlipidemia: Secondary | ICD-10-CM

## 2019-05-12 MED ORDER — OLMESARTAN MEDOXOMIL-HCTZ 20-12.5 MG PO TABS: 1.0000 | ORAL_TABLET | Freq: Every day | ORAL | 1 refills | Status: DC

## 2019-05-12 MED ORDER — VENLAFAXINE HCL ER 150 MG PO CP24: 150.0000 mg | ORAL_CAPSULE | Freq: Every day | ORAL | 1 refills | Status: DC

## 2019-05-12 MED ORDER — ORPHENADRINE CITRATE ER 100 MG PO TB12: 100.0000 mg | ORAL_TABLET | Freq: Every evening | ORAL | 0 refills | Status: DC | PRN

## 2019-05-12 MED ORDER — SIMVASTATIN 40 MG PO TABS: ORAL_TABLET | ORAL | 1 refills | Status: DC

## 2019-05-12 MED ORDER — METOPROLOL SUCCINATE ER 100 MG PO TB24: 100.0000 mg | ORAL_TABLET | Freq: Every day | ORAL | 1 refills | Status: DC

## 2019-05-12 NOTE — Progress Notes (Signed)
Date:  05/12/2019   Name:  Whitney Long   DOB:  05-06-1956   MRN:  [id number]   Chief Complaint: Hypertension  Hypertension This is a chronic problem. The problem is controlled. Pertinent negatives include no chest pain, headaches, palpitations or shortness of breath. There are no associated agents to hypertension. Past treatments include angiotensin blockers, beta blockers and diuretics. The current treatment provides significant improvement.  Hip Pain  There was no injury mechanism. The pain is present in the left hip. The quality of the pain is described as aching and cramping. The pain is mild. The pain has been fluctuating since onset. Pertinent negatives include no numbness. She has tried NSAIDs (and muscle relaxants) for the symptoms.   Lab Results  Component Value Date   CREATININE 0.86 11/07/2018   BUN 16 11/07/2018   NA 141 11/07/2018   K 4.4 11/07/2018   CL 101 11/07/2018   CO2 22 11/07/2018   Lab Results  Component Value Date   CHOL 210 (H) 11/07/2018   HDL 63 11/07/2018   LDLCALC 113 (H) 11/07/2018   TRIG 171 (H) 11/07/2018   CHOLHDL 3.3 11/07/2018      Review of Systems  Constitutional: Negative for appetite change, fatigue, fever and unexpected weight change.  Eyes: Negative for visual disturbance.  Respiratory: Negative for cough, chest tightness and shortness of breath.   Cardiovascular: Negative for chest pain, palpitations and leg swelling.  Gastrointestinal: Negative for abdominal pain.  Genitourinary: Negative for dysuria and hematuria.  Musculoskeletal: Positive for arthralgias and back pain. Negative for gait problem and joint swelling.  Skin: Positive for rash (scattered excoriations from scratching).  Neurological: Negative for dizziness, tremors, numbness and headaches.  Psychiatric/Behavioral: Negative for dysphoric mood and sleep disturbance.    Patient Active Problem List   Diagnosis Date Noted  . Essential hypertension 05/28/2016  .  Environmental and seasonal allergies 05/28/2016  . Mixed hyperlipidemia 05/28/2016  . 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 Gastroesophageal reflux disease without esophagitis 05/28/2016  . Osteopenia 05/28/2016  . Vitamin B12 deficiency 05/28/2016  . Macrocytosis 05/28/2016  . History of kidney stones 05/28/2016  . Lesion of right lobe of liver 05/28/2016  . 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 Posterior tibial tendinitis 12/08/2015  . Sprain of deltoid ligament of right ankle 12/07/2015  . Stress fracture of right tibia 12/07/2015    Allergies  Allergen Reactions  . Sulfa Antibiotics Palpitations    Other reaction(s): Headache    Past Surgical History:  Procedure Laterality Date  . ABDOMINAL HYSTERECTOMY  2007   prolapse  . BUNIONECTOMY Left   . COLONOSCOPY  2016  . FRONTAL SINUS EXPLORATION    . GANGLION CYST EXCISION  1978  . RHINOPLASTY      Social History   Tobacco Use  . Smoking status: Former Smoker    Packs/day: 0.15    Years: 4.00    Pack years: 0.60    Types: Cigarettes    Quit date: 1978    Years since quitting: 42.8  . Smokeless tobacco: Never Used  Substance Use Topics  . Alcohol use: No  . Drug use: No     Medication list has been reviewed and updated.  Current Meds  Medication Sig  . Calcium Carbonate-Vitamin D (CALCIUM 500/D PO) Take by mouth.  . meloxicam (MOBIC) 15 MG tablet Take 1 tablet (15 mg total) by mouth daily. (Patient taking differently: Take 15 mg by mouth as needed. )  . metoprolol succinate (TOPROL-XL)  100 MG 24 hr tablet Take 1 tablet (100 mg total) by mouth daily. Take with or immediately following a meal.  . olmesartan-hydrochlorothiazide (BENICAR HCT) 20-12.5 MG tablet Take 1 tablet by mouth daily.  . simvastatin (ZOCOR) 40 MG tablet TAKE 1 TABLET DAILY AT     6:00PM.  . venlafaxine XR (EFFEXOR-XR) 150 MG 24 hr capsule Take 1 capsule (150 mg total) by mouth daily with breakfast.  . [DISCONTINUED] metoprolol succinate (TOPROL-XL)  100 MG 24 hr tablet TAKE 1 TABLET DAILY . TAKE WITH OR IMMEDIATELY        FOLLOWING A MEAL.  . [DISCONTINUED] olmesartan-hydrochlorothiazide (BENICAR HCT) 20-12.5 MG tablet TAKE 1 TABLET DAILY  . [DISCONTINUED] simvastatin (ZOCOR) 40 MG tablet TAKE 1 TABLET DAILY AT     6:00PM.  . [DISCONTINUED] venlafaxine XR (EFFEXOR-XR) 150 MG 24 hr capsule TAKE 1 CAPSULE DAILY WITH  BREAKFAST    PHQ 2/9 Scores 05/12/2019 11/07/2018 05/07/2018 04/17/2017  PHQ - 2 Score 0 0 0 0    BP Readings from Last 3 Encounters:  05/12/19 128/84  04/24/19 132/83  11/07/18 104/70    Physical Exam Vitals signs and nursing note reviewed.  Constitutional:      General: She is not in acute distress.    Appearance: She is well-developed.  HENT:     Head: Normocephalic and atraumatic.  Cardiovascular:     Rate and Rhythm: Normal rate and regular rhythm.     Pulses: Normal pulses.  Pulmonary:     Effort: Pulmonary effort is normal. No respiratory distress.     Breath sounds: Normal breath sounds. No wheezing or rhonchi.  Musculoskeletal: Normal range of motion.  Skin:    General: Skin is warm and dry.     Capillary Refill: Capillary refill takes less than 2 seconds.     Findings: Lesion present. No rash.     Comments: Scattered macules with excoriation on arms, legs and back  Neurological:     General: No focal deficit present.     Mental Status: She is alert and oriented to person, place, and time.  Psychiatric:        Attention and Perception: Attention normal.        Mood and Affect: Mood normal.        Behavior: Behavior normal.        Thought Content: Thought content normal.     Wt Readings from Last 3 Encounters:  05/12/19 199 lb 9.6 oz (90.5 kg)  04/24/19 200 lb (90.7 kg)  11/07/18 204 lb (92.5 kg)    BP 128/84   Pulse 90   Ht 5\' 2"  (1.575 m)   Wt 199 lb 9.6 oz (90.5 kg)   SpO2 96%   BMI 36.51 kg/m   Assessment and Plan: 1. Essential hypertension Clinically stable exam with well  controlled BP.   Tolerating medications, benicar hct 20/12.5 and metoprolol 100 mg daily, without side effects at this time. Pt to continue current regimen and low sodium diet; benefits of regular exercise as able discussed. - olmesartan-hydrochlorothiazide (BENICAR HCT) 20-12.5 MG tablet; Take 1 tablet by mouth daily.  Dispense: 90 tablet; Refill: 1  2. Mixed hyperlipidemia Tolerating statin medication without side effects at this time Continue same therapy without change at this time. - simvastatin (ZOCOR) 40 MG tablet; TAKE 1 TABLET DAILY AT     6:00PM.  Dispense: 90 tablet; Refill: 1  3. Menopause syndrome Clinically doing well on current regimen - venlafaxine XR (EFFEXOR-XR) 150 MG  24 hr capsule; Take 1 capsule (150 mg total) by mouth daily with breakfast.  Dispense: 90 capsule; Refill: 1  4. Muscle spasm of left lower extremity Hip and leg - continue norflex which is refilled today - orphenadrine (NORFLEX) 100 MG tablet; Take 1 tablet (100 mg total) by mouth at bedtime as needed for muscle spasms.  Dispense: 90 tablet; Refill: 0   Partially dictated using Animal nutritionist. Any errors are unintentional.  Bari Edward, MD [facility]  05/12/2019

## 2019-07-16 ENCOUNTER — Other Ambulatory Visit: Payer: Self-pay

## 2019-07-16 ENCOUNTER — Ambulatory Visit
Admission: RE | Admit: 2019-07-16 | Discharge: 2019-07-16 | Disposition: A | Discharge status: Home / Self Care | Payer: Managed Care, Other (non HMO) | Source: Ambulatory Visit | Attending: Internal Medicine | Admitting: Internal Medicine

## 2019-07-16 DIAGNOSIS — Z1231 Encounter for screening mammogram for malignant neoplasm of breast: Secondary | ICD-10-CM | POA: Diagnosis not present

## 2019-11-10 ENCOUNTER — Other Ambulatory Visit: Payer: Self-pay

## 2019-11-10 ENCOUNTER — Ambulatory Visit (INDEPENDENT_AMBULATORY_CARE_PROVIDER_SITE_OTHER): Payer: 59 | Admitting: Internal Medicine

## 2019-11-10 ENCOUNTER — Encounter: Payer: Self-pay | Admitting: Internal Medicine

## 2019-11-10 VITALS — BP 124/78 | HR 96 | Temp 96.9°F | Ht 62.0 in | Wt 198.0 lb

## 2019-11-10 DIAGNOSIS — N951 Menopausal and female climacteric states: Secondary | ICD-10-CM

## 2019-11-10 DIAGNOSIS — I10 Essential (primary) hypertension: Secondary | ICD-10-CM | POA: Diagnosis not present

## 2019-11-10 DIAGNOSIS — E782 Mixed hyperlipidemia: Secondary | ICD-10-CM

## 2019-11-10 DIAGNOSIS — M8580 Other specified disorders of bone density and structure, unspecified site: Secondary | ICD-10-CM

## 2019-11-10 DIAGNOSIS — Z0000 Encounter for general adult medical examination without abnormal findings: Secondary | ICD-10-CM | POA: Diagnosis not present

## 2019-11-10 DIAGNOSIS — E538 Deficiency of other specified B group vitamins: Secondary | ICD-10-CM | POA: Diagnosis not present

## 2019-11-10 LAB — POCT URINALYSIS DIPSTICK
Bilirubin, UA: NEGATIVE
Glucose, UA: NEGATIVE
Ketones, UA: NEGATIVE
Nitrite, UA: NEGATIVE
Protein, UA: NEGATIVE
Spec Grav, UA: 1.015 (ref 1.010–1.025)
Urobilinogen, UA: 0.2 E.U./dL
pH, UA: 5 (ref 5.0–8.0)

## 2019-11-10 MED ORDER — SIMVASTATIN 40 MG PO TABS: ORAL_TABLET | ORAL | 3 refills | Status: DC

## 2019-11-10 MED ORDER — METOPROLOL SUCCINATE ER 100 MG PO TB24: 100.0000 mg | ORAL_TABLET | Freq: Every day | ORAL | 3 refills | Status: DC

## 2019-11-10 MED ORDER — OLMESARTAN MEDOXOMIL-HCTZ 20-12.5 MG PO TABS: 1.0000 | ORAL_TABLET | Freq: Every day | ORAL | 3 refills | Status: DC

## 2019-11-10 MED ORDER — VENLAFAXINE HCL ER 150 MG PO CP24: 150.0000 mg | ORAL_CAPSULE | Freq: Every day | ORAL | 3 refills | Status: AC

## 2019-11-10 NOTE — Patient Instructions (Signed)
[facility] - Dr. Ebony Cargo

## 2019-11-10 NOTE — Progress Notes (Signed)
Date:  11/10/2019   Name:  Whitney Long   DOB:  1955-09-10   MRN:  [id number]   Chief Complaint: Annual Exam (Breast Exam. No pap- hysterectomy. ) Whitney Long is a 64 y.o. female who presents today for her Complete Annual Exam. She feels well. She reports exercising walking regularly. She reports she is sleeping well. She denies breast issues.  Mammogram  06/2019 Pap discontinued DEXA  2017 osteopenia Colonoscopy  2016 no report available done in [facility] Immunization History  Administered Date(s) Administered  . Influenza,inj,Quad PF,6+ Mos 05/28/2016, 04/17/2017, 05/07/2018, 04/11/2019  . PFIZER SARS-COV-2 Vaccination 10/15/2019, 11/05/2019  . Tdap 07/30/2012, 04/24/2019  . Zoster 07/30/2014  . Zoster Recombinat (Shingrix) 11/07/2018, 02/06/2019    Hypertension This is a chronic problem. The problem is controlled. Pertinent negatives include no chest pain, headaches, palpitations or shortness of breath.  Hyperlipidemia This is a chronic problem. The problem is controlled. Pertinent negatives include no chest pain or shortness of breath. Current antihyperlipidemic treatment includes statins. The current treatment provides significant improvement of lipids. There are no compliance problems.     Lab Results  Component Value Date   CREATININE 0.86 11/07/2018   BUN 16 11/07/2018   NA 141 11/07/2018   K 4.4 11/07/2018   CL 101 11/07/2018   CO2 22 11/07/2018   Lab Results  Component Value Date   CHOL 210 (H) 11/07/2018   HDL 63 11/07/2018   LDLCALC 113 (H) 11/07/2018   TRIG 171 (H) 11/07/2018   CHOLHDL 3.3 11/07/2018   Lab Results  Component Value Date   TSH 2.880 11/07/2018   Lab Results  Component Value Date   HGBA1C 5.5 10/01/2016   Lab Results  Component Value Date   WBC 8.7 11/07/2018   HGB 14.7 11/07/2018   HCT 43.0 11/07/2018   MCV 101 (H) 11/07/2018   PLT 350 11/07/2018   Lab Results  Component Value Date   ALT 19 11/07/2018   AST 18 11/07/2018    ALKPHOS 100 11/07/2018   BILITOT 0.3 11/07/2018     Review of Systems  Constitutional: Negative for chills, fatigue and fever.  HENT: Negative for congestion, hearing loss, tinnitus, trouble swallowing and voice change.   Eyes: Negative for visual disturbance.  Respiratory: Negative for cough, chest tightness, shortness of breath and wheezing.   Cardiovascular: Negative for chest pain, palpitations and leg swelling.  Gastrointestinal: Negative for abdominal pain, constipation, diarrhea and vomiting.  Endocrine: Negative for polydipsia and polyuria.  Genitourinary: Negative for dysuria, frequency, genital sores, vaginal bleeding and vaginal discharge.  Musculoskeletal: Negative for arthralgias, gait problem and joint swelling.  Skin: Negative for color change and rash.  Neurological: Negative for dizziness, tremors, light-headedness and headaches.  Hematological: Negative for adenopathy. Does not bruise/bleed easily.  Psychiatric/Behavioral: Negative for dysphoric mood and sleep disturbance. The patient is not nervous/anxious.     Patient Active Problem List   Diagnosis Date Noted  . Essential hypertension 05/28/2016  . Environmental and seasonal allergies 05/28/2016  . Mixed hyperlipidemia 05/28/2016  . 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 Gastroesophageal reflux disease without esophagitis 05/28/2016  . Osteopenia 05/28/2016  . Vitamin B12 deficiency 05/28/2016  . Macrocytosis 05/28/2016  . History of kidney stones 05/28/2016  . Lesion of right lobe of liver 05/28/2016    Allergies  Allergen Reactions  . Sulfa Antibiotics Palpitations    Other reaction(s): Headache    Past Surgical History:  Procedure Laterality Date  . ABDOMINAL HYSTERECTOMY  2007   prolapse  .  BUNIONECTOMY Left   . COLONOSCOPY  2016  . FRONTAL SINUS EXPLORATION    . GANGLION CYST EXCISION  1978  . RHINOPLASTY      Social History   Tobacco Use  . Smoking status: Former Smoker    Packs/day: 0.15     Years: 4.00    Pack years: 0.60    Types: Cigarettes    Quit date: 1978    Years since quitting: 43.3  . Smokeless tobacco: Never Used  Substance Use Topics  . Alcohol use: No  . Drug use: No     Medication list has been reviewed and updated.  Current Meds  Medication Sig  . Calcium Carbonate-Vitamin D (CALCIUM 500/D PO) Take by mouth.  . meloxicam (MOBIC) 15 MG tablet Take 1 tablet (15 mg total) by mouth daily. (Patient taking differently: Take 15 mg by mouth as needed. )  . metoprolol succinate (TOPROL-XL) 100 MG 24 hr tablet Take 1 tablet (100 mg total) by mouth daily. Take with or immediately following a meal.  . olmesartan-hydrochlorothiazide (BENICAR HCT) 20-12.5 MG tablet Take 1 tablet by mouth daily.  . orphenadrine (NORFLEX) 100 MG tablet Take 1 tablet (100 mg total) by mouth at bedtime as needed for muscle spasms.  . simvastatin (ZOCOR) 40 MG tablet TAKE 1 TABLET DAILY AT     6:00PM.  . venlafaxine XR (EFFEXOR-XR) 150 MG 24 hr capsule Take 1 capsule (150 mg total) by mouth daily with breakfast.    PHQ 2/9 Scores 11/10/2019 05/12/2019 11/07/2018 05/07/2018  PHQ - 2 Score 0 0 0 0  PHQ- 9 Score 1 - - -   GAD 7 : Generalized Anxiety Score 11/10/2019  Nervous, Anxious, on Edge 0  Control/stop worrying 0  Worry too much - different things 0  Trouble relaxing 0  Restless 0  Easily annoyed or irritable 0  Afraid - awful might happen 0  Total GAD 7 Score 0  Anxiety Difficulty Not difficult at all      BP Readings from Last 3 Encounters:  11/10/19 124/78  05/12/19 128/84  04/24/19 132/83    Physical Exam Vitals and nursing note reviewed.  Constitutional:      General: She is not in acute distress.    Appearance: She is well-developed.  HENT:     Head: Normocephalic and atraumatic.     Right Ear: Tympanic membrane and ear canal normal.     Left Ear: Tympanic membrane and ear canal normal.     Nose:     Right Sinus: No maxillary sinus tenderness.     Left  Sinus: No maxillary sinus tenderness.  Eyes:     General: No scleral icterus.       Right eye: No discharge.        Left eye: No discharge.     Conjunctiva/sclera: Conjunctivae normal.  Neck:     Thyroid: No thyromegaly.     Vascular: No carotid bruit.  Cardiovascular:     Rate and Rhythm: Normal rate and regular rhythm.     Pulses: Normal pulses.     Heart sounds: Normal heart sounds.  Pulmonary:     Effort: Pulmonary effort is normal. No respiratory distress.     Breath sounds: No wheezing.  Chest:     Breasts:        Right: No mass, nipple discharge, skin change or tenderness.        Left: No mass, nipple discharge, skin change or tenderness.  Abdominal:  General: Bowel sounds are normal.     Palpations: Abdomen is soft.     Tenderness: There is no abdominal tenderness.  Musculoskeletal:        General: Normal range of motion.     Cervical back: Normal range of motion. No erythema.  Lymphadenopathy:     Cervical: No cervical adenopathy.  Skin:    General: Skin is warm and dry.     Findings: No rash.  Neurological:     Mental Status: She is alert and oriented to person, place, and time.     Cranial Nerves: No cranial nerve deficit.     Sensory: No sensory deficit.     Deep Tendon Reflexes: Reflexes are normal and symmetric.  Psychiatric:        Speech: Speech normal.        Behavior: Behavior normal.        Thought Content: Thought content normal.     Wt Readings from Last 3 Encounters:  11/10/19 198 lb (89.8 kg)  05/12/19 199 lb 9.6 oz (90.5 kg)  04/24/19 200 lb (90.7 kg)    BP 124/78   Pulse 96   Temp (!) 96.9 F (36.1 C) (Temporal)   Ht 5\' 2"  (1.575 m)   Wt 198 lb (89.8 kg)   SpO2 97%   BMI 36.21 kg/m   Assessment and Plan: 1. Annual physical exam Normal exam except for weight Work on diet and more exercise - POCT urinalysis dipstick  2. Essential hypertension Clinically stable exam with well controlled BP. Tolerating medications without  side effects at this time. Pt to continue current regimen and low sodium diet; benefits of regular exercise as able discussed. - CBC with Differential/Platelet - Comprehensive metabolic panel - TSH - metoprolol succinate (TOPROL-XL) 100 MG 24 hr tablet; Take 1 tablet (100 mg total) by mouth daily. Take with or immediately following a meal.  Dispense: 90 tablet; Refill: 3 - olmesartan-hydrochlorothiazide (BENICAR HCT) 20-12.5 MG tablet; Take 1 tablet by mouth daily.  Dispense: 90 tablet; Refill: 3  3. Mixed hyperlipidemia Tolerating statin medication without side effects at this time LDL is not at goal of < 70 on current dose Continue same therapy without change at this time. - Lipid panel - simvastatin (ZOCOR) 40 MG tablet; TAKE 1 TABLET DAILY AT     6:00PM.  Dispense: 90 tablet; Refill: 3  4. Osteopenia, unspecified location Continue calcium, vitamin D and exercise - DG Bone Density; Future - VITAMIN D 25 Hydroxy (Vit-D Deficiency, Fractures)  5. Menopause syndrome - venlafaxine XR (EFFEXOR-XR) 150 MG 24 hr capsule; Take 1 capsule (150 mg total) by mouth daily with breakfast.  Dispense: 90 capsule; Refill: 3  6. Vitamin B12 deficiency Continue oral supplementation - Vitamin B12   Partially dictated using . Any errors are unintentional.  Animal nutritionist, MD [facility]  11/10/2019

## 2019-11-11 LAB — CBC WITH DIFFERENTIAL/PLATELET
Basophils Absolute: 0 10*3/uL (ref 0.0–0.2)
Basos: 1 %
EOS (ABSOLUTE): 0.1 10*3/uL (ref 0.0–0.4)
Eos: 2 %
Hematocrit: 42.9 % (ref 34.0–46.6)
Hemoglobin: 14.4 g/dL (ref 11.1–15.9)
Immature Grans (Abs): 0 10*3/uL (ref 0.0–0.1)
Immature Granulocytes: 0 %
Lymphocytes Absolute: 2.3 10*3/uL (ref 0.7–3.1)
Lymphs: 39 %
MCH: 33.4 pg — ABNORMAL HIGH (ref 26.6–33.0)
MCHC: 33.6 g/dL (ref 31.5–35.7)
MCV: 100 fL — ABNORMAL HIGH (ref 79–97)
Monocytes Absolute: 0.5 10*3/uL (ref 0.1–0.9)
Monocytes: 8 %
Neutrophils Absolute: 3 10*3/uL (ref 1.4–7.0)
Neutrophils: 50 %
Platelets: 310 10*3/uL (ref 150–450)
RBC: 4.31 x10E6/uL (ref 3.77–5.28)
RDW: 13 % (ref 11.7–15.4)
WBC: 6 10*3/uL (ref 3.4–10.8)

## 2019-11-11 LAB — LIPID PANEL
Chol/HDL Ratio: 3.3 ratio (ref 0.0–4.4)
Cholesterol, Total: 197 mg/dL (ref 100–199)
HDL: 60 mg/dL (ref >39)
LDL Chol Calc (NIH): 109 mg/dL — ABNORMAL HIGH (ref 0–99)
Triglycerides: 162 mg/dL — ABNORMAL HIGH (ref 0–149)
VLDL Cholesterol Cal: 28 mg/dL (ref 5–40)

## 2019-11-11 LAB — COMPREHENSIVE METABOLIC PANEL
ALT: 50 IU/L — ABNORMAL HIGH (ref 0–32)
AST: 40 IU/L (ref 0–40)
Albumin/Globulin Ratio: 1.5 (ref 1.2–2.2)
Albumin: 4.2 g/dL (ref 3.8–4.8)
Alkaline Phosphatase: 132 IU/L — ABNORMAL HIGH (ref 39–117)
BUN/Creatinine Ratio: 14 (ref 12–28)
BUN: 12 mg/dL (ref 8–27)
Bilirubin Total: 0.3 mg/dL (ref 0.0–1.2)
CO2: 23 mmol/L (ref 20–29)
Calcium: 9.7 mg/dL (ref 8.7–10.3)
Chloride: 102 mmol/L (ref 96–106)
Creatinine, Ser: 0.85 mg/dL (ref 0.57–1.00)
GFR calc Af Amer: 84 mL/min/{1.73_m2} (ref >59)
GFR calc non Af Amer: 73 mL/min/{1.73_m2} (ref >59)
Globulin, Total: 2.8 g/dL (ref 1.5–4.5)
Glucose: 91 mg/dL (ref 65–99)
Potassium: 4 mmol/L (ref 3.5–5.2)
Sodium: 142 mmol/L (ref 134–144)
Total Protein: 7 g/dL (ref 6.0–8.5)

## 2019-11-11 LAB — VITAMIN B12: Vitamin B-12: 317 pg/mL (ref 232–1245)

## 2019-11-11 LAB — VITAMIN D 25 HYDROXY (VIT D DEFICIENCY, FRACTURES): Vit D, 25-Hydroxy: 31.8 ng/mL (ref 30.0–100.0)

## 2019-11-11 LAB — TSH: TSH: 2.54 u[IU]/mL (ref 0.450–4.500)

## 2019-12-01 ENCOUNTER — Ambulatory Visit
Admission: RE | Admit: 2019-12-01 | Discharge: 2019-12-01 | Disposition: A | Discharge status: Home / Self Care | Payer: 59 | Source: Ambulatory Visit | Attending: Internal Medicine | Admitting: Internal Medicine

## 2019-12-01 ENCOUNTER — Other Ambulatory Visit: Payer: Self-pay

## 2019-12-01 DIAGNOSIS — M8580 Other specified disorders of bone density and structure, unspecified site: Secondary | ICD-10-CM | POA: Insufficient documentation

## 2020-02-09 ENCOUNTER — Telehealth: Payer: Self-pay | Admitting: Internal Medicine

## 2020-02-09 ENCOUNTER — Other Ambulatory Visit: Payer: Self-pay

## 2020-02-09 DIAGNOSIS — R748 Abnormal levels of other serum enzymes: Secondary | ICD-10-CM

## 2020-02-09 NOTE — Telephone Encounter (Unsigned)
Copied from CRM [id number]. Topic: General - Other >> Feb 09, 2020  9:05 AM Gwenlyn Fudge wrote: Reason for CRM: Pt called and is requesting to speak with PCP nurse regarding her appt for lab appt. Pt is requesting instead to have the labs sent to [facility]. Please advise.

## 2020-02-09 NOTE — Telephone Encounter (Signed)
Called pt told her that I ordered her labs that she can go to any [facility]. Pt verbalized understanding.  KP

## 2020-02-11 ENCOUNTER — Other Ambulatory Visit: Payer: Self-pay

## 2020-02-11 LAB — HEPATIC FUNCTION PANEL
ALT: 17 IU/L (ref 0–32)
AST: 20 IU/L (ref 0–40)
Albumin: 4.4 g/dL (ref 3.8–4.8)
Alkaline Phosphatase: 99 IU/L (ref 48–121)
Bilirubin Total: <0.2 mg/dL (ref 0.0–1.2)
Bilirubin, Direct: 0.09 mg/dL (ref 0.00–0.40)
Total Protein: 7.1 g/dL (ref 6.0–8.5)

## 2020-05-13 ENCOUNTER — Encounter: Payer: Self-pay | Admitting: Internal Medicine

## 2020-05-13 ENCOUNTER — Other Ambulatory Visit: Payer: Self-pay

## 2020-05-13 ENCOUNTER — Ambulatory Visit (INDEPENDENT_AMBULATORY_CARE_PROVIDER_SITE_OTHER): Payer: 59 | Admitting: Internal Medicine

## 2020-05-13 VITALS — BP 138/86 | HR 91 | Temp 98.5°F | Ht 62.0 in | Wt 200.0 lb

## 2020-05-13 DIAGNOSIS — M62838 Other muscle spasm: Secondary | ICD-10-CM | POA: Diagnosis not present

## 2020-05-13 DIAGNOSIS — Z23 Encounter for immunization: Secondary | ICD-10-CM

## 2020-05-13 DIAGNOSIS — Z1231 Encounter for screening mammogram for malignant neoplasm of breast: Secondary | ICD-10-CM

## 2020-05-13 DIAGNOSIS — I10 Essential (primary) hypertension: Secondary | ICD-10-CM | POA: Diagnosis not present

## 2020-05-13 MED ORDER — ORPHENADRINE CITRATE ER 100 MG PO TB12: 100.0000 mg | ORAL_TABLET | Freq: Every evening | ORAL | 1 refills | Status: AC | PRN

## 2020-05-13 NOTE — Progress Notes (Signed)
Date:  05/13/2020   Name:  Whitney Long   DOB:  10-Nov-1955   MRN:  [id number]   Chief Complaint: Hypertension (Follow up.) and Flu Vaccine (Reg dose.)  Hypertension This is a chronic problem. The problem is controlled. Pertinent negatives include no chest pain, palpitations or shortness of breath. Past treatments include beta blockers, angiotensin blockers and diuretics. The current treatment provides significant improvement.  Back Pain This is a recurrent problem. The pain is moderate. Associated symptoms include leg pain (on the right). Pertinent negatives include no chest pain, fever, numbness or weakness. She has tried ice, NSAIDs, heat and muscle relaxant for the symptoms.    Lab Results  Component Value Date   CREATININE 0.85 11/10/2019   BUN 12 11/10/2019   NA 142 11/10/2019   K 4.0 11/10/2019   CL 102 11/10/2019   CO2 23 11/10/2019   Lab Results  Component Value Date   CHOL 197 11/10/2019   HDL 60 11/10/2019   LDLCALC 109 (H) 11/10/2019   TRIG 162 (H) 11/10/2019   CHOLHDL 3.3 11/10/2019   Lab Results  Component Value Date   TSH 2.540 11/10/2019   Lab Results  Component Value Date   HGBA1C 5.5 10/01/2016   Lab Results  Component Value Date   WBC 6.0 11/10/2019   HGB 14.4 11/10/2019   HCT 42.9 11/10/2019   MCV 100 (H) 11/10/2019   PLT 310 11/10/2019   Lab Results  Component Value Date   ALT 17 02/10/2020   AST 20 02/10/2020   ALKPHOS 99 02/10/2020   BILITOT <0.2 02/10/2020     Review of Systems  Constitutional: Negative for chills, fatigue and fever.  Respiratory: Negative for cough, chest tightness and shortness of breath.   Cardiovascular: Negative for chest pain, palpitations and leg swelling.  Musculoskeletal: Positive for back pain (radiating to right hip and thigh). Negative for joint swelling and myalgias.  Neurological: Negative for weakness and numbness.    Patient Active Problem List   Diagnosis Date Noted  . Essential  hypertension 05/28/2016  . Environmental and seasonal allergies 05/28/2016  . Mixed hyperlipidemia 05/28/2016  . 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 Gastroesophageal reflux disease without esophagitis 05/28/2016  . Osteopenia 05/28/2016  . Vitamin B12 deficiency 05/28/2016  . Macrocytosis 05/28/2016  . History of kidney stones 05/28/2016  . Lesion of right lobe of liver 05/28/2016    Allergies  Allergen Reactions  . Sulfa Antibiotics Palpitations    Other reaction(s): Headache    Past Surgical History:  Procedure Laterality Date  . ABDOMINAL HYSTERECTOMY  2007   prolapse  . BUNIONECTOMY Left   . COLONOSCOPY  2016  . FRONTAL SINUS EXPLORATION    . GANGLION CYST EXCISION  1978  . RHINOPLASTY      Social History   Tobacco Use  . Smoking status: Former Smoker    Packs/day: 0.15    Years: 4.00    Pack years: 0.60    Types: Cigarettes    Quit date: 1978    Years since quitting: 43.8  . Smokeless tobacco: Never Used  Vaping Use  . Vaping Use: Never used  Substance Use Topics  . Alcohol use: No  . Drug use: No     Medication list has been reviewed and updated.  Current Meds  Medication Sig  . Calcium Carbonate-Vitamin D (CALCIUM 500/D PO) Take by mouth.  . meloxicam (MOBIC) 15 MG tablet Take 1 tablet (15 mg total) by mouth daily. (Patient taking differently:  Take 15 mg by mouth as needed. )  . metoprolol succinate (TOPROL-XL) 100 MG 24 hr tablet Take 1 tablet (100 mg total) by mouth daily. Take with or immediately following a meal.  . olmesartan-hydrochlorothiazide (BENICAR HCT) 20-12.5 MG tablet Take 1 tablet by mouth daily.  . orphenadrine (NORFLEX) 100 MG tablet Take 1 tablet (100 mg total) by mouth at bedtime as needed for muscle spasms.  . simvastatin (ZOCOR) 40 MG tablet TAKE 1 TABLET DAILY AT     6:00PM.  . venlafaxine XR (EFFEXOR-XR) 150 MG 24 hr capsule Take 1 capsule (150 mg total) by mouth daily with breakfast.    PHQ 2/9 Scores 05/13/2020 11/10/2019  05/12/2019 11/07/2018  PHQ - 2 Score 0 0 0 0  PHQ- 9 Score 0 1 - -    GAD 7 : Generalized Anxiety Score 05/13/2020 11/10/2019  Nervous, Anxious, on Edge 0 0  Control/stop worrying 0 0  Worry too much - different things 0 0  Trouble relaxing 0 0  Restless 0 0  Easily annoyed or irritable 0 0  Afraid - awful might happen 0 0  Total GAD 7 Score 0 0  Anxiety Difficulty Not difficult at all Not difficult at all    BP Readings from Last 3 Encounters:  05/13/20 138/86  11/10/19 124/78  05/12/19 128/84    Physical Exam Vitals and nursing note reviewed.  Constitutional:      General: She is not in acute distress.    Appearance: She is well-developed.  HENT:     Head: Normocephalic and atraumatic.  Cardiovascular:     Rate and Rhythm: Normal rate and regular rhythm.     Heart sounds: No murmur heard.   Pulmonary:     Effort: Pulmonary effort is normal. No respiratory distress.     Breath sounds: No wheezing or rhonchi.  Musculoskeletal:        General: Normal range of motion.     Lumbar back: No spasms or tenderness. Negative right straight leg raise test and negative left straight leg raise test.  Skin:    General: Skin is warm and dry.     Findings: No rash.  Neurological:     Mental Status: She is alert and oriented to person, place, and time.     Deep Tendon Reflexes:     Reflex Scores:      Bicep reflexes are 2+ on the right side and 2+ on the left side.      Patellar reflexes are 2+ on the right side and 2+ on the left side. Psychiatric:        Behavior: Behavior normal.        Thought Content: Thought content normal.     Wt Readings from Last 3 Encounters:  05/13/20 200 lb (90.7 kg)  11/10/19 198 lb (89.8 kg)  05/12/19 199 lb 9.6 oz (90.5 kg)    BP 138/86   Pulse 91   Temp 98.5 F (36.9 C) (Oral)   Ht 5\' 2"  (1.575 m)   Wt 200 lb (90.7 kg)   SpO2 96%   BMI 36.58 kg/m   Assessment and Plan: 1. Muscle spasm of left lower extremity Continue  ice/heat/topical rubs Refill Norflex Avoid nsaids and tylenol due to liver enzyme elevations - orphenadrine (NORFLEX) 100 MG tablet; Take 1 tablet (100 mg total) by mouth at bedtime as needed for muscle spasms.  Dispense: 90 tablet; Refill: 1  2. Essential hypertension Clinically stable exam with well controlled BP. Tolerating  medications without side effects at this time. Pt to continue current regimen and low sodium diet; benefits of regular exercise as able discussed.  3. Need for immunization against influenza - Flu Vaccine QUAD 36+ mos IM  4. Encounter for screening mammogram for breast cancer Due in December at [facility] - MM 3D SCREEN BREAST BILATERAL; Future   Partially dictated using Animal nutritionist. Any errors are unintentional.  Bari Edward, MD [facility]  05/13/2020

## 2020-07-18 ENCOUNTER — Ambulatory Visit
Admission: RE | Admit: 2020-07-18 | Discharge: 2020-07-18 | Disposition: A | Discharge status: Home / Self Care | Payer: 59 | Source: Ambulatory Visit | Attending: Internal Medicine | Admitting: Internal Medicine

## 2020-07-18 ENCOUNTER — Other Ambulatory Visit: Payer: Self-pay

## 2020-07-18 DIAGNOSIS — Z1231 Encounter for screening mammogram for malignant neoplasm of breast: Secondary | ICD-10-CM | POA: Insufficient documentation

## 2020-09-07 DIAGNOSIS — Z90710 Acquired absence of both cervix and uterus: Secondary | ICD-10-CM | POA: Insufficient documentation

## 2020-10-31 ENCOUNTER — Other Ambulatory Visit: Payer: Self-pay | Admitting: Internal Medicine

## 2020-10-31 DIAGNOSIS — E782 Mixed hyperlipidemia: Secondary | ICD-10-CM

## 2020-10-31 DIAGNOSIS — I10 Essential (primary) hypertension: Secondary | ICD-10-CM

## 2020-10-31 NOTE — Telephone Encounter (Signed)
Requested Prescriptions  Pending Prescriptions Disp Refills  . metoprolol succinate (TOPROL-XL) 100 MG 24 hr tablet [Pharmacy Med Name: METOPROL SUC TAB 100MG  ER] 90 tablet 0    Sig: TAKE 1 TABLET DAILY WITH ORIMMEDIATELY FOLLOWING A    MEAL     Cardiovascular:  Beta Blockers Passed - 10/31/2020  8:06 PM      Passed - Last BP in normal range    BP Readings from Last 1 Encounters:  05/13/20 138/86         Passed - Last Heart Rate in normal range    Pulse Readings from Last 1 Encounters:  05/13/20 91         Passed - Valid encounter within last 6 months    Recent Outpatient Visits          5 months ago Muscle spasm of left lower extremity   [facility] 05/15/20, MD   11 months ago Annual physical exam   [facility], MD   1 year ago Essential hypertension   [facility], MD   1 year ago Annual physical exam   [facility], MD   2 years ago Essential hypertension   [facility], MD      Future Appointments            In 2 months Reubin Milan Judithann Graves, MD [facility]           . olmesartan-hydrochlorothiazide (BENICAR HCT) 20-12.5 MG tablet [Pharmacy Med Name: OLMESART HCT TAB 20-12.5] 90 tablet 0    Sig: TAKE 1 TABLET DAILY     Cardiovascular: ARB + Diuretic Combos Failed - 10/31/2020  8:06 PM      Failed - K in normal range and within 180 days    Potassium  Date Value Ref Range Status  11/10/2019 4.0 3.5 - 5.2 mmol/L Final         Failed - Na in normal range and within 180 days    Sodium  Date Value Ref Range Status  11/10/2019 142 134 - 144 mmol/L Final         Failed - Cr in normal range and within 180 days    Creatinine, Ser  Date Value Ref Range Status  11/10/2019 0.85 0.57 - 1.00 mg/dL Final         Failed - Ca in normal range and within 180 days    Calcium  Date Value Ref Range Status  11/10/2019 9.7 8.7 - 10.3  mg/dL Final         Passed - Patient is not pregnant      Passed - Last BP in normal range    BP Readings from Last 1 Encounters:  05/13/20 138/86         Passed - Valid encounter within last 6 months    Recent Outpatient Visits          5 months ago Muscle spasm of left lower extremity   [facility] 05/15/20, MD   11 months ago Annual physical exam   [facility], MD   1 year ago Essential hypertension   [facility], MD   1 year ago Annual physical exam   [facility], MD   2 years ago Essential hypertension   [facility], MD  Future Appointments            In 2 months Judithann Graves, Nyoka Cowden, MD [facility]           . simvastatin (ZOCOR) 40 MG tablet [Pharmacy Med Name: SIMVASTATIN  TAB 40MG ] 90 tablet 0    Sig: TAKE 1 TABLET DAILY AT     6:00PM     Cardiovascular:  Antilipid - Statins Failed - 10/31/2020  8:06 PM      Failed - LDL in normal range and within 360 days    LDL Chol Calc (NIH)  Date Value Ref Range Status  11/10/2019 109 (H) 0 - 99 mg/dL Final         Failed - Triglycerides in normal range and within 360 days    Triglycerides  Date Value Ref Range Status  11/10/2019 162 (H) 0 - 149 mg/dL Final         Passed - Total Cholesterol in normal range and within 360 days    Cholesterol, Total  Date Value Ref Range Status  11/10/2019 197 100 - 199 mg/dL Final         Passed - HDL in normal range and within 360 days    HDL  Date Value Ref Range Status  11/10/2019 60 >39 mg/dL Final         Passed - Patient is not pregnant      Passed - Valid encounter within last 12 months    Recent Outpatient Visits          5 months ago Muscle spasm of left lower extremity   [facility] 11/12/2019, MD   11 months ago Annual physical exam   [facility], MD   1 year ago  Essential hypertension   [facility], MD   1 year ago Annual physical exam   [facility], MD   2 years ago Essential hypertension   [facility], MD      Future Appointments            In 2 months Reubin Milan Judithann Graves, MD [facility]

## 2020-11-11 ENCOUNTER — Encounter: Payer: 59 | Admitting: Internal Medicine

## 2021-01-16 ENCOUNTER — Encounter: Payer: 59 | Admitting: Internal Medicine

## 2021-01-22 ENCOUNTER — Other Ambulatory Visit: Payer: Self-pay | Admitting: Internal Medicine

## 2021-01-22 DIAGNOSIS — E782 Mixed hyperlipidemia: Secondary | ICD-10-CM

## 2021-01-22 DIAGNOSIS — I10 Essential (primary) hypertension: Secondary | ICD-10-CM

## 2021-01-23 NOTE — Telephone Encounter (Signed)
Notes to clinic: review for refill Looks like patient has establish with [facility]    Requested Prescriptions  Pending Prescriptions Disp Refills   metoprolol succinate (TOPROL-XL) 100 MG 24 hr tablet [Pharmacy Med Name: METOPROL SUC TAB 100MG  ER] 90 tablet 0    Sig: TAKE 1 TABLET DAILY WITH ORIMMEDIATELY FOLLOWING A    MEAL      Cardiovascular:  Beta Blockers Failed - 01/22/2021  8:17 PM      Failed - Valid encounter within last 6 months    Recent Outpatient Visits           8 months ago Muscle spasm of left lower extremity   [facility] 01/24/2021, MD   1 year ago Annual physical exam   [facility], MD   1 year ago Essential hypertension   [facility], MD   2 years ago Annual physical exam   [facility], MD   2 years ago Essential hypertension   [facility], MD                Passed - Last BP in normal range    BP Readings from Last 1 Encounters:  05/13/20 138/86          Passed - Last Heart Rate in normal range    Pulse Readings from Last 1 Encounters:  05/13/20 91            simvastatin (ZOCOR) 40 MG tablet [Pharmacy Med Name: SIMVASTATIN  TAB 40MG ] 90 tablet 0    Sig: TAKE 1 TABLET DAILY AT     6:00PM      Cardiovascular:  Antilipid - Statins Failed - 01/22/2021  8:17 PM      Failed - Total Cholesterol in normal range and within 360 days    Cholesterol, Total  Date Value Ref Range Status  11/10/2019 197 100 - 199 mg/dL Final          Failed - LDL in normal range and within 360 days    LDL Chol Calc (NIH)  Date Value Ref Range Status  11/10/2019 109 (H) 0 - 99 mg/dL Final          Failed - HDL in normal range and within 360 days    HDL  Date Value Ref Range Status  11/10/2019 60 >39 mg/dL Final          Failed - Triglycerides in normal range and within 360 days    Triglycerides  Date Value Ref Range Status   11/10/2019 162 (H) 0 - 149 mg/dL Final          Passed - Patient is not pregnant      Passed - Valid encounter within last 12 months    Recent Outpatient Visits           8 months ago Muscle spasm of left lower extremity   [facility] 11/12/2019, MD   1 year ago Annual physical exam   [facility] Reubin Milan, MD   1 year ago Essential hypertension   [facility] Reubin Milan, MD   2 years ago Annual physical exam   [facility] Reubin Milan, MD   2 years ago Essential hypertension   [facility] Reubin Milan, MD  olmesartan-hydrochlorothiazide (BENICAR HCT) 20-12.5 MG tablet [Pharmacy Med Name: OLMESART HCT TAB 20-12.5] 90 tablet 0    Sig: TAKE 1 TABLET DAILY      Cardiovascular: ARB + Diuretic Combos Failed - 01/22/2021  8:17 PM      Failed - K in normal range and within 180 days    Potassium  Date Value Ref Range Status  11/10/2019 4.0 3.5 - 5.2 mmol/L Final          Failed - Na in normal range and within 180 days    Sodium  Date Value Ref Range Status  11/10/2019 142 134 - 144 mmol/L Final          Failed - Cr in normal range and within 180 days    Creatinine, Ser  Date Value Ref Range Status  11/10/2019 0.85 0.57 - 1.00 mg/dL Final          Failed - Ca in normal range and within 180 days    Calcium  Date Value Ref Range Status  11/10/2019 9.7 8.7 - 10.3 mg/dL Final          Failed - Valid encounter within last 6 months    Recent Outpatient Visits           8 months ago Muscle spasm of left lower extremity   [facility] Reubin Milan, MD   1 year ago Annual physical exam   [facility] Reubin Milan, MD   1 year ago Essential hypertension   [facility] Reubin Milan, MD   2 years ago Annual physical exam   [facility] Reubin Milan, MD   2 years ago Essential hypertension   [facility]  Reubin Milan, MD                Passed - Patient is not pregnant      Passed - Last BP in normal range    BP Readings from Last 1 Encounters:  05/13/20 138/86

## 2021-06-15 ENCOUNTER — Other Ambulatory Visit: Payer: Self-pay | Admitting: Internal Medicine
